# Patient Record
Sex: Female | Born: 1985 | Race: Black or African American | Hispanic: No | Marital: Married | Smoking: Never smoker
Health system: Southern US, Community
[De-identification: ages and names within clinical notes are randomized; demographics above are authoritative.]

## PROBLEM LIST (undated history)

## (undated) ENCOUNTER — Inpatient Hospital Stay (HOSPITAL_COMMUNITY): Payer: Self-pay

## (undated) DIAGNOSIS — B009 Herpesviral infection, unspecified: Secondary | ICD-10-CM

## (undated) DIAGNOSIS — N390 Urinary tract infection, site not specified: Secondary | ICD-10-CM

## (undated) DIAGNOSIS — I10 Essential (primary) hypertension: Secondary | ICD-10-CM

## (undated) DIAGNOSIS — G43909 Migraine, unspecified, not intractable, without status migrainosus: Secondary | ICD-10-CM

## (undated) DIAGNOSIS — J189 Pneumonia, unspecified organism: Secondary | ICD-10-CM

## (undated) DIAGNOSIS — E039 Hypothyroidism, unspecified: Secondary | ICD-10-CM

## (undated) DIAGNOSIS — B977 Papillomavirus as the cause of diseases classified elsewhere: Secondary | ICD-10-CM

## (undated) DIAGNOSIS — R87629 Unspecified abnormal cytological findings in specimens from vagina: Secondary | ICD-10-CM

## (undated) DIAGNOSIS — N870 Mild cervical dysplasia: Secondary | ICD-10-CM

## (undated) DIAGNOSIS — O139 Gestational [pregnancy-induced] hypertension without significant proteinuria, unspecified trimester: Secondary | ICD-10-CM

## (undated) HISTORY — DX: Migraine, unspecified, not intractable, without status migrainosus: G43.909

## (undated) HISTORY — DX: Mild cervical dysplasia: N87.0

## (undated) HISTORY — DX: Urinary tract infection, site not specified: N39.0

## (undated) HISTORY — DX: Papillomavirus as the cause of diseases classified elsewhere: B97.7

## (undated) HISTORY — PX: TUBAL LIGATION: SHX77

## (undated) HISTORY — PX: BREAST BIOPSY: SHX20

---

## 2003-10-05 HISTORY — PX: COLPOSCOPY: SHX161

## 2004-01-24 ENCOUNTER — Emergency Department (HOSPITAL_COMMUNITY): Admission: EM | Admit: 2004-01-24 | Discharge: 2004-01-24 | Payer: Self-pay | Admitting: Emergency Medicine

## 2005-04-10 ENCOUNTER — Emergency Department (HOSPITAL_COMMUNITY): Admission: EM | Admit: 2005-04-10 | Discharge: 2005-04-10 | Payer: Self-pay | Admitting: Emergency Medicine

## 2005-05-05 ENCOUNTER — Emergency Department: Payer: Self-pay | Admitting: Unknown Physician Specialty

## 2005-12-31 ENCOUNTER — Other Ambulatory Visit: Admission: RE | Admit: 2005-12-31 | Discharge: 2005-12-31 | Payer: Self-pay | Admitting: Obstetrics and Gynecology

## 2006-05-13 ENCOUNTER — Other Ambulatory Visit: Admission: RE | Admit: 2006-05-13 | Discharge: 2006-05-13 | Payer: Self-pay | Admitting: Obstetrics and Gynecology

## 2007-03-22 ENCOUNTER — Other Ambulatory Visit: Admission: RE | Admit: 2007-03-22 | Discharge: 2007-03-22 | Payer: Self-pay | Admitting: Obstetrics and Gynecology

## 2008-03-04 DIAGNOSIS — B977 Papillomavirus as the cause of diseases classified elsewhere: Secondary | ICD-10-CM

## 2008-03-04 HISTORY — DX: Papillomavirus as the cause of diseases classified elsewhere: B97.7

## 2008-03-22 ENCOUNTER — Other Ambulatory Visit: Admission: RE | Admit: 2008-03-22 | Discharge: 2008-03-22 | Payer: Self-pay | Admitting: Obstetrics and Gynecology

## 2009-05-15 ENCOUNTER — Encounter: Payer: Self-pay | Admitting: Women's Health

## 2009-05-15 ENCOUNTER — Other Ambulatory Visit: Admission: RE | Admit: 2009-05-15 | Discharge: 2009-05-15 | Payer: Self-pay | Admitting: Obstetrics and Gynecology

## 2009-05-15 ENCOUNTER — Ambulatory Visit: Payer: Self-pay | Admitting: Women's Health

## 2009-08-01 ENCOUNTER — Ambulatory Visit: Payer: Self-pay | Admitting: Obstetrics and Gynecology

## 2009-11-26 ENCOUNTER — Ambulatory Visit: Payer: Self-pay | Admitting: Women's Health

## 2009-11-26 ENCOUNTER — Other Ambulatory Visit: Admission: RE | Admit: 2009-11-26 | Discharge: 2009-11-26 | Payer: Self-pay | Admitting: Obstetrics and Gynecology

## 2010-07-29 ENCOUNTER — Ambulatory Visit: Payer: Self-pay | Admitting: Women's Health

## 2010-07-29 ENCOUNTER — Other Ambulatory Visit: Admission: RE | Admit: 2010-07-29 | Discharge: 2010-07-29 | Payer: Self-pay | Admitting: Obstetrics and Gynecology

## 2011-07-26 DIAGNOSIS — N870 Mild cervical dysplasia: Secondary | ICD-10-CM | POA: Insufficient documentation

## 2011-07-26 DIAGNOSIS — B977 Papillomavirus as the cause of diseases classified elsewhere: Secondary | ICD-10-CM | POA: Insufficient documentation

## 2011-08-02 ENCOUNTER — Ambulatory Visit (INDEPENDENT_AMBULATORY_CARE_PROVIDER_SITE_OTHER): Payer: BC Managed Care – PPO | Admitting: Women's Health

## 2011-08-02 ENCOUNTER — Encounter: Payer: Self-pay | Admitting: Women's Health

## 2011-08-02 ENCOUNTER — Other Ambulatory Visit (HOSPITAL_COMMUNITY)
Admission: RE | Admit: 2011-08-02 | Discharge: 2011-08-02 | Disposition: A | Discharge status: Home / Self Care | Payer: BC Managed Care – PPO | Source: Ambulatory Visit | Attending: Obstetrics and Gynecology | Admitting: Obstetrics and Gynecology

## 2011-08-02 VITALS — BP 120/70 | Ht 61.0 in | Wt 153.0 lb

## 2011-08-02 DIAGNOSIS — IMO0001 Reserved for inherently not codable concepts without codable children: Secondary | ICD-10-CM

## 2011-08-02 DIAGNOSIS — Z01419 Encounter for gynecological examination (general) (routine) without abnormal findings: Secondary | ICD-10-CM | POA: Insufficient documentation

## 2011-08-02 DIAGNOSIS — N898 Other specified noninflammatory disorders of vagina: Secondary | ICD-10-CM

## 2011-08-02 DIAGNOSIS — B373 Candidiasis of vulva and vagina: Secondary | ICD-10-CM

## 2011-08-02 DIAGNOSIS — R82998 Other abnormal findings in urine: Secondary | ICD-10-CM

## 2011-08-02 DIAGNOSIS — Z309 Encounter for contraceptive management, unspecified: Secondary | ICD-10-CM

## 2011-08-02 MED ORDER — NORETHIN ACE-ETH ESTRAD-FE 1-20 MG-MCG PO TABS: 1.0000 | ORAL_TABLET | Freq: Every day | ORAL | Status: DC

## 2011-08-02 MED ORDER — TERCONAZOLE 0.8 % VA CREA: 1.0000 | TOPICAL_CREAM | Freq: Every day | VAGINAL | Status: AC

## 2011-08-02 NOTE — Progress Notes (Signed)
Cassidy [name] 1986-05-10 [id number]    History:    The patient presents for annual exam.  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   Past medical history, past surgical history, family history and social history were all reviewed and documented in the EPIC chart.   ROS:  A  ROS was performed and pertinent positives and negatives are included in the history.  Exam:  Filed Vitals:   08/02/11 1543  BP: 120/70    General appearance:  Normal Head/Neck:  Normal, without cervical or supraclavicular adenopathy. Thyroid:  Symmetrical, normal in size, without palpable masses or nodularity. Respiratory  Effort:  Normal  Auscultation:  Clear without wheezing or rhonchi Cardiovascular  Auscultation:  Regular rate, without rubs, murmurs or gallops  Edema/varicosities:  Not grossly evident Abdominal  Soft,nontender, without masses, guarding or rebound.  Liver/spleen:  No organomegaly noted  Hernia:  None appreciated  Skin  Inspection:  Grossly normal  Palpation:  Grossly normal Neurologic/psychiatric  Orientation:  Normal with appropriate conversation.  Mood/affect:  Normal  Genitourinary    Breasts: Examined lying and sitting.     Right: Without masses, retractions, discharge or axillary adenopathy.     Left: Without masses, retractions, discharge or axillary adenopathy.   Inguinal/mons:  Normal without inguinal adenopathy  External genitalia:  Normal  BUS/Urethra/Skene's glands:  Normal  Bladder:  Normal  Vagina:  Normal  Cervix:  Normal  Uterus:   normal in size, shape and contour.  Midline and mobile  Adnexa/parametria:     Rt: Without masses or tenderness.   Lt: Without masses or tenderness.  Anus and perineum: Normal  Digital rectal exam: Normal sphincter tone without palpated masses or tenderness  Assessment/Plan:  24 y.o.[name] G0  for annual exam with a complaint of vaginal discharge and occasional headaches. 6 day monthly  cycle on YAZ, has not been sexually active in greater than a year. Gardasil series completed in 08. History of CIN-1 in 2009, normal Paps after.  Yeast  Plan: Reviewed slight increased risk with Yaz's progestin will change to Loestrin 1/20, prescription, proper use, slight risk for blood clots and strokes reviewed. Condoms encouraged to become sexually active. SBEs, exercise, calcium rich diet, MVI daily encouraged. Yeast - Terazol 3 one applicator at bedtime x3 prescription, proper use, was reviewed. CBC, UA and Pap.   Harrington Challenger [facility], 4:22 PM 08/02/2011 2

## 2011-08-19 ENCOUNTER — Emergency Department (HOSPITAL_COMMUNITY)
Admission: EM | Admit: 2011-08-19 | Discharge: 2011-08-19 | Disposition: A | Discharge status: Home / Self Care | Payer: BC Managed Care – PPO | Attending: Emergency Medicine | Admitting: Emergency Medicine

## 2011-08-19 ENCOUNTER — Encounter (HOSPITAL_COMMUNITY): Payer: Self-pay | Admitting: *Deleted

## 2011-08-19 ENCOUNTER — Emergency Department (HOSPITAL_COMMUNITY): Payer: BC Managed Care – PPO

## 2011-08-19 DIAGNOSIS — R079 Chest pain, unspecified: Secondary | ICD-10-CM | POA: Insufficient documentation

## 2011-08-19 DIAGNOSIS — J189 Pneumonia, unspecified organism: Secondary | ICD-10-CM

## 2011-08-19 DIAGNOSIS — R5381 Other malaise: Secondary | ICD-10-CM | POA: Insufficient documentation

## 2011-08-19 DIAGNOSIS — R0602 Shortness of breath: Secondary | ICD-10-CM | POA: Insufficient documentation

## 2011-08-19 DIAGNOSIS — R5383 Other fatigue: Secondary | ICD-10-CM | POA: Insufficient documentation

## 2011-08-19 DIAGNOSIS — R112 Nausea with vomiting, unspecified: Secondary | ICD-10-CM | POA: Insufficient documentation

## 2011-08-19 LAB — URINALYSIS, ROUTINE W REFLEX MICROSCOPIC
Glucose, UA: NEGATIVE mg/dL
Leukocytes, UA: NEGATIVE
Protein, ur: NEGATIVE mg/dL
pH: 5.5 (ref 5.0–8.0)

## 2011-08-19 LAB — COMPREHENSIVE METABOLIC PANEL
ALT: 15 U/L (ref 0–35)
AST: 22 U/L (ref 0–37)
Albumin: 3.6 g/dL (ref 3.5–5.2)
CO2: 22 mEq/L (ref 19–32)
Calcium: 8.4 mg/dL (ref 8.4–10.5)
Chloride: 101 mEq/L (ref 96–112)
GFR calc non Af Amer: >90 mL/min (ref >90)
Sodium: 134 mEq/L — ABNORMAL LOW (ref 135–145)
Total Bilirubin: 0.2 mg/dL — ABNORMAL LOW (ref 0.3–1.2)

## 2011-08-19 LAB — URINE MICROSCOPIC-ADD ON

## 2011-08-19 LAB — CBC
Platelets: 242 10*3/uL (ref 150–400)
RBC: 3.96 MIL/uL (ref 3.87–5.11)
RDW: 13.1 % (ref 11.5–15.5)
WBC: 6.9 10*3/uL (ref 4.0–10.5)

## 2011-08-19 MED ORDER — IBUPROFEN 800 MG PO TABS
800.0000 mg | ORAL_TABLET | Freq: Once | ORAL | Status: AC
  Administered 2011-08-19: 800 mg via ORAL

## 2011-08-19 MED ORDER — SODIUM CHLORIDE 0.9 % IV BOLUS (SEPSIS)
1000.0000 mL | Freq: Once | INTRAVENOUS | Status: AC
  Administered 2011-08-19: 1000 mL via INTRAVENOUS

## 2011-08-19 MED ORDER — AZITHROMYCIN 250 MG PO TABS: 500.0000 mg | ORAL_TABLET | Freq: Every day | ORAL | Status: DC

## 2011-08-19 MED ORDER — AZITHROMYCIN 250 MG PO TABS: ORAL_TABLET | ORAL | Status: DC

## 2011-08-19 MED ORDER — DEXTROSE 5 % IV SOLN
500.0000 mg | Freq: Once | INTRAVENOUS | Status: AC
  Administered 2011-08-19: 500 mg via INTRAVENOUS
  Filled 2011-08-19: qty 500

## 2011-08-19 MED ORDER — SODIUM CHLORIDE 0.9 % IV SOLN
Freq: Once | INTRAVENOUS | Status: AC
  Administered 2011-08-19: 16:00:00 via INTRAVENOUS

## 2011-08-19 MED ORDER — ONDANSETRON 4 MG PO TBDP
4.0000 mg | ORAL_TABLET | Freq: Once | ORAL | Status: AC
Start: 2011-08-19 — End: 2011-08-19
  Administered 2011-08-19: 4 mg via ORAL
  Filled 2011-08-19: qty 1

## 2011-08-19 MED ORDER — IBUPROFEN 800 MG PO TABS
ORAL_TABLET | ORAL | Status: AC
  Administered 2011-08-19: 800 mg via ORAL
  Filled 2011-08-19: qty 1

## 2011-08-19 MED ORDER — ACETAMINOPHEN 325 MG PO TABS
650.0000 mg | ORAL_TABLET | Freq: Once | ORAL | Status: AC
  Administered 2011-08-19: 650 mg via ORAL
  Filled 2011-08-19: qty 2

## 2011-08-19 NOTE — ED Notes (Signed)
Dr Fredricka Bonine notified of patient pulse rate at 1415.

## 2011-08-19 NOTE — ED Provider Notes (Signed)
History     CSN: [id number] Arrival date & time: 08/19/2011  1:43 PM   First MD Initiated Contact with Patient 08/19/11 1423      No chief complaint on file.   (Consider location/radiation/quality/duration/timing/severity/associated sxs/prior treatment) The history is provided by the patient.  Pt is a 25 YO female who comes in today with nausea and vomiting and cough. She states that she was coughing and having a sore throat which is now resolved. She is also having fevers at home. She is having some muscle soreness in her upper legs but no calf tenderness or soreness. 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 She has not had recent travel anywhere. She has no other medical problems and takes only birth control pills. She has taken some nyquil for her symptoms recently. No flu vaccine. Has had TB test in the past that was negative. She states that occasionally her sputum is productive and was slightly brown once. No weight loss or night sweats.   Past Medical History  Diagnosis Date  . 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  Family History  Problem Relation Age of Onset  . Hypertension Father   . Hypertension Mother     History  Substance Use Topics  . Smoking status: Never Smoker   . Smokeless tobacco: Never Used  . Alcohol Use: No    OB History    Grav Para Term Preterm Abortions TAB SAB Ect Mult Living   0               Review of Systems  Constitutional: Positive for fever, chills and fatigue. Negative for diaphoresis, activity change, appetite change and unexpected weight change.  HENT: Positive for congestion. Negative for sore throat and sinus pressure.   Respiratory: Positive for cough. Negative for choking, chest tightness, shortness of breath and stridor.   Cardiovascular: Negative for chest pain, palpitations and leg swelling.    Gastrointestinal: Positive for nausea and vomiting. Negative for abdominal pain, diarrhea, constipation, blood in stool and abdominal distention.  Genitourinary: Negative for dysuria, urgency, frequency, flank pain, decreased urine volume, enuresis and difficulty urinating.  Musculoskeletal: Negative.   Skin: Negative.   Neurological: Negative for dizziness, tremors, seizures, syncope, facial asymmetry, speech difficulty, weakness, light-headedness, numbness and headaches.  Hematological: Negative.   Psychiatric/Behavioral: Negative.     Allergies  Review of patient's allergies indicates no known allergies.  Home Medications   Current Outpatient Rx  Name Route Sig Dispense Refill  . ADAPALENE 0.1 % EX CREA Topical Apply 1 application topically at bedtime.      Azzie Roup ACE-ETH ESTRAD-FE 1-20 MG-MCG PO TABS Oral Take 1 tablet by mouth daily. 1 Package 11  . NYQUIL PO Oral Take 30 mLs by mouth 2 (two) times daily as needed. Cough and congestion       BP 124/59  Pulse 138  Temp(Src) 102.6 F (39.2 C) (Oral)  Resp 16  SpO2 99%  LMP 07/21/2011  Physical Exam  Constitutional: She is oriented to person, place, and time. She appears well-developed and well-nourished. Distressed: In mild distress.  HENT:  Head: Normocephalic and atraumatic.  Eyes: EOM are normal. Pupils are equal, round, and reactive to light.  Neck: Normal range of motion. Neck supple.  Cardiovascular: Regular rhythm.        Tachycardic.  Pulmonary/Chest:  Effort normal. No respiratory distress. She has no wheezes.       Good air flow bilaterally.  Abdominal: Soft. Bowel sounds are normal. She exhibits no distension. There is no tenderness. There is no rebound.  Musculoskeletal: Normal range of motion. She exhibits tenderness. She exhibits no edema.       Tender to palpation in the medial thighs.  Neurological: She is alert and oriented to person, place, and time. No cranial nerve deficit.  Skin: Skin is warm  and dry.    ED Course  Procedures (including critical care time)   Labs Reviewed  CBC  COMPREHENSIVE METABOLIC PANEL  LIPASE, BLOOD  URINALYSIS, ROUTINE W REFLEX MICROSCOPIC   Dg Chest Portable 1 View  08/19/2011  *RADIOLOGY REPORT*  Clinical Data: Cough and shortness of breath.  PORTABLE CHEST - 1 VIEW  Comparison: None.  Findings: Low lung volumes. Slight increased markings in the retrocardiac region and left lung base suggest early infiltrate. Clear right lung.  Normal heart size.  No bony abnormality.  IMPRESSION: Cannot exclude early left lower lobe infiltrate.  Original Report Authenticated By: Elsie Stain, M.D.     1. CAP (community acquired pneumonia)       MDM  Pt appears to be febrile. Will give tylenol for fever and zofran for nausea. Will also give some fluids to rehydrate her. Possible etiologies could include flu, viral GI infection. Pt does not have any focal respiratory sounds concerning for pneumonia. Will check basic lab work and CXR to rule out pneumonia. Will check CMP to rule out any gallbladder etiology.    3:45 PM  Pt's CXR is not excluding LLL infiltrate so will treat as CAP. Pending the rest of the laboratory results will pass off care to the ED doctor coming onto shift.      Genella Mech, MD 08/19/11 [phone]

## 2011-08-19 NOTE — ED Notes (Signed)
Awaiting completion of abx prior to releasing pt

## 2011-08-19 NOTE — ED Provider Notes (Signed)
  I performed a history and physical examination of Cassidy [name] and discussed her management with Dr. Dorise Hiss.  I agree with the history, physical, assessment, and plan of care, with the following exceptions: None The patient is awake, alert, and oriented appropriately, nontoxic in appearance, in no apparent distress, but with a fever and mild sinus tachycardia. Her HEENT exam is normal, with moist mucous membranes, no significant pharyngeal erythema, and no apparent otitis media. Her lungs are clear to auscultation bilaterally, but she has a rhonchorous cough. I was present for the following procedures: None Time Spent in Critical Care of the patient: None Time spent in discussions with the patient and family: 5 minutes. The patient appears to have an early left lower lobe pneumonia that would be a community-acquired pneumonia. Manus Rudd, MD 08/19/11 [phone]

## 2011-08-19 NOTE — ED Provider Notes (Signed)
Care of the patient was assumed from Dr. Doylene Canard. The patient's x-ray suggests left lower lobe pneumonia. The patient and her family were made aware of this finding. She notes that she is feeling minimally better here in the ED. Her vitals improved, though she remains tachycardic. She was afebrile following Tylenol provision. She will receive a first dose of antibiotics twice a day, and be discharged to continue her azithromycin at home.  Gerhard Munch, MD 08/19/11 [phone]

## 2011-08-20 ENCOUNTER — Emergency Department (HOSPITAL_COMMUNITY)
Admission: EM | Admit: 2011-08-20 | Discharge: 2011-08-20 | Disposition: A | Discharge status: Home / Self Care | Payer: BC Managed Care – PPO | Attending: Emergency Medicine | Admitting: Emergency Medicine

## 2011-08-20 ENCOUNTER — Encounter (HOSPITAL_COMMUNITY): Payer: Self-pay | Admitting: Emergency Medicine

## 2011-08-20 DIAGNOSIS — R5381 Other malaise: Secondary | ICD-10-CM | POA: Insufficient documentation

## 2011-08-20 DIAGNOSIS — R05 Cough: Secondary | ICD-10-CM | POA: Insufficient documentation

## 2011-08-20 DIAGNOSIS — R509 Fever, unspecified: Secondary | ICD-10-CM | POA: Insufficient documentation

## 2011-08-20 DIAGNOSIS — IMO0001 Reserved for inherently not codable concepts without codable children: Secondary | ICD-10-CM | POA: Insufficient documentation

## 2011-08-20 DIAGNOSIS — R059 Cough, unspecified: Secondary | ICD-10-CM | POA: Insufficient documentation

## 2011-08-20 DIAGNOSIS — R0602 Shortness of breath: Secondary | ICD-10-CM

## 2011-08-20 DIAGNOSIS — J189 Pneumonia, unspecified organism: Secondary | ICD-10-CM

## 2011-08-20 HISTORY — DX: Pneumonia, unspecified organism: J18.9

## 2011-08-20 MED ORDER — ALBUTEROL SULFATE (5 MG/ML) 0.5% IN NEBU
5.0000 mg | INHALATION_SOLUTION | Freq: Once | RESPIRATORY_TRACT | Status: AC
  Administered 2011-08-20: 5 mg via RESPIRATORY_TRACT
  Filled 2011-08-20: qty 1

## 2011-08-20 MED ORDER — IBUPROFEN 800 MG PO TABS
800.0000 mg | ORAL_TABLET | Freq: Once | ORAL | Status: AC
  Administered 2011-08-20: 800 mg via ORAL
  Filled 2011-08-20: qty 1

## 2011-08-20 MED ORDER — SODIUM CHLORIDE 0.9 % IV BOLUS (SEPSIS)
1000.0000 mL | Freq: Once | INTRAVENOUS | Status: AC
  Administered 2011-08-20: 1000 mL via INTRAVENOUS

## 2011-08-20 MED ORDER — SODIUM CHLORIDE 0.9 % IV BOLUS (SEPSIS)
500.0000 mL | Freq: Once | INTRAVENOUS | Status: AC
  Administered 2011-08-20: 500 mL via INTRAVENOUS

## 2011-08-20 MED ORDER — ALBUTEROL SULFATE HFA 108 (90 BASE) MCG/ACT IN AERS: 1.0000 | INHALATION_SPRAY | Freq: Four times a day (QID) | RESPIRATORY_TRACT | Status: DC | PRN

## 2011-08-20 NOTE — ED Provider Notes (Signed)
History     CSN: [id number] Arrival date & time: 08/20/2011 11:18 AM   First MD Initiated Contact with Patient 08/20/11 1132      Chief Complaint  Patient presents with  . Shortness of Breath  (Consider location/radiation/quality/duration/timing/severity/associated sxs/prior treatment) The history is provided by the patient.   patient is a 25 year old female who presents with a complaint of shortness of breath. She was seen and evaluated yesterday in the emergency department and was diagnosed with pneumonia. She was given a prescription for an antibiotic and has taken one day of the antibiotic. The patient reports she has had continued cough, fever, and persistent shortness of breath even at rest. There has not been a worsening of her symptoms. She denies headache, lightheadedness, chest pain, pleuritic pan. She has been taking NyQuil without relief of her symptoms. She has been taking Tylenol for her fever with partial relief.    Past Medical History  Diagnosis Date  . 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 Pneumonia     History reviewed. No pertinent past surgical history.  Family History  Problem Relation Age of Onset  . Hypertension Father   . Hypertension Mother     History  Substance Use Topics  . Smoking status: Never Smoker   . Smokeless tobacco: Never Used  . Alcohol Use: No     Review of Systems  Constitutional: Positive for fever, chills and appetite change.  HENT: Positive for congestion and rhinorrhea. Negative for ear pain, nosebleeds, sore throat, neck pain, neck stiffness and tinnitus.   Eyes: Negative for pain and visual disturbance.  Respiratory: Positive for cough and shortness of breath. Negative for chest tightness.   Cardiovascular: Negative for chest pain, palpitations and leg swelling.  Gastrointestinal: Positive for nausea. Negative for vomiting and abdominal pain.    Musculoskeletal: Positive for myalgias. Negative for back pain, joint swelling and gait problem.  Skin: Negative for rash and wound.  Neurological: Positive for weakness. Negative for dizziness, syncope and numbness.  Hematological: Does not bruise/bleed easily.  Psychiatric/Behavioral: Negative for behavioral problems and confusion.    Allergies  Review of patient's allergies indicates no known allergies.  Home Medications   Current Outpatient Rx  Name Route Sig Dispense Refill  . ACETAMINOPHEN 325 MG PO TABS Oral Take 325 mg by mouth every 6 (six) hours as needed. pain     . ADAPALENE 0.1 % EX CREA Topical Apply 1 application topically at bedtime.     . AZITHROMYCIN 250 MG PO TABS Oral Take 500 mg by mouth daily. Day 2 of therapy. For 5 day course     . NORETHIN ACE-ETH ESTRAD-FE 1-20 MG-MCG PO TABS Oral Take 1 tablet by mouth daily. 1 Package 11  . NYQUIL PO Oral Take 30 mLs by mouth 2 (two) times daily as needed. Cough and congestion      BP 123/67  Pulse 112  Temp(Src) 101.8 F (38.8 C) (Oral)  Resp 23  SpO2 99%  LMP 07/21/2011  Physical Exam  Constitutional: She is oriented to person, place, and time. She appears well-developed and well-nourished.       Uncomfortable appearing  HENT:  Head: Normocephalic.       Mucous membranes moist  Eyes: Pupils are equal, round, and reactive to light.  Neck: Normal range of motion. Neck supple.  Cardiovascular: Regular rhythm, normal heart sounds and intact distal  pulses.        Tachycardia  Pulmonary/Chest: Effort normal and breath sounds normal. No respiratory distress. She has no wheezes. She has no rales.       Good air movement bilaterally  Abdominal: Soft. Bowel sounds are normal. She exhibits no distension. There is no tenderness.  Musculoskeletal: Normal range of motion. She exhibits no edema and no tenderness.  Lymphadenopathy:    She has no cervical adenopathy.  Neurological: She is alert and oriented to person,  place, and time. No cranial nerve deficit. Coordination normal.  Skin: Skin is warm and dry. No rash noted.  Psychiatric: She has a normal mood and affect. Her behavior is normal.    ED Course  Procedures (including critical care time)  Labs Reviewed - No data to display Dg Chest Portable 1 View  08/19/2011  *RADIOLOGY REPORT*  Clinical Data: Cough and shortness of breath.  PORTABLE CHEST - 1 VIEW  Comparison: None.  Findings: Low lung volumes. Slight increased markings in the retrocardiac region and left lung base suggest early infiltrate. Clear right lung.  Normal heart size.  No bony abnormality.  IMPRESSION: Cannot exclude early left lower lobe infiltrate.  Original Report Authenticated By: Elsie Stain, M.D.     1. Pneumonia   2. Shortness of breath       MDM  An otherwise healthy young female with diagnosis of pneumonia as seen on yesterday's x-ray. Her shortness of breath has been relieved significantly with an albuterol treatment. However she is still tachycardic. I will give her some IV fluids with plan to discharge home afterward. 2:58pm        Elwyn Reach [place], [place] 08/21/11 [phone]

## 2011-08-20 NOTE — ED Notes (Signed)
Pt presenting to ed with c/o diagnosed yesterday here with pneumonia and she is not feeling any better

## 2011-08-21 NOTE — ED Provider Notes (Signed)
  I performed a history and physical examination of Langley Gauss and discussed her management with Dr. Dorise Hiss.  I agree with the history, physical, assessment, and plan of care, with the following exceptions: None  I was present for the following procedures: None Time Spent in Critical Care of the patient: None Time spent in discussions with the patient and family: 5 mins  Keandria Berrocal D    Felisa Bonier, MD 08/21/11 1432

## 2011-08-23 NOTE — ED Provider Notes (Signed)
Evaluation and management procedures were performed by the PA/NP under my supervision/collaboration.   Dione Booze, MD 08/23/11 1256

## 2011-09-09 ENCOUNTER — Ambulatory Visit (INDEPENDENT_AMBULATORY_CARE_PROVIDER_SITE_OTHER): Payer: BC Managed Care – PPO

## 2011-09-09 DIAGNOSIS — D649 Anemia, unspecified: Secondary | ICD-10-CM

## 2011-09-09 DIAGNOSIS — L241 Irritant contact dermatitis due to oils and greases: Secondary | ICD-10-CM

## 2011-09-09 DIAGNOSIS — J309 Allergic rhinitis, unspecified: Secondary | ICD-10-CM

## 2011-09-20 ENCOUNTER — Ambulatory Visit (INDEPENDENT_AMBULATORY_CARE_PROVIDER_SITE_OTHER): Payer: BC Managed Care – PPO

## 2011-09-20 DIAGNOSIS — E0590 Thyrotoxicosis, unspecified without thyrotoxic crisis or storm: Secondary | ICD-10-CM

## 2011-09-30 ENCOUNTER — Other Ambulatory Visit: Payer: Self-pay | Admitting: Family Medicine

## 2011-09-30 DIAGNOSIS — E0590 Thyrotoxicosis, unspecified without thyrotoxic crisis or storm: Secondary | ICD-10-CM

## 2011-10-01 ENCOUNTER — Institutional Professional Consult (permissible substitution): Payer: BC Managed Care – PPO | Admitting: Internal Medicine

## 2011-10-05 DIAGNOSIS — J189 Pneumonia, unspecified organism: Secondary | ICD-10-CM

## 2011-10-05 HISTORY — DX: Pneumonia, unspecified organism: J18.9

## 2011-10-09 ENCOUNTER — Ambulatory Visit (INDEPENDENT_AMBULATORY_CARE_PROVIDER_SITE_OTHER): Payer: BC Managed Care – PPO

## 2011-10-09 DIAGNOSIS — R000 Tachycardia, unspecified: Secondary | ICD-10-CM

## 2011-10-09 DIAGNOSIS — N898 Other specified noninflammatory disorders of vagina: Secondary | ICD-10-CM

## 2011-10-09 DIAGNOSIS — I10 Essential (primary) hypertension: Secondary | ICD-10-CM

## 2011-10-12 ENCOUNTER — Encounter (HOSPITAL_COMMUNITY)
Admission: RE | Admit: 2011-10-12 | Discharge: 2011-10-12 | Disposition: A | Discharge status: Home / Self Care | Payer: BC Managed Care – PPO | Source: Ambulatory Visit | Attending: Family Medicine | Admitting: Family Medicine

## 2011-10-12 DIAGNOSIS — E0590 Thyrotoxicosis, unspecified without thyrotoxic crisis or storm: Secondary | ICD-10-CM | POA: Insufficient documentation

## 2011-10-13 ENCOUNTER — Encounter (HOSPITAL_COMMUNITY)
Admission: RE | Admit: 2011-10-13 | Discharge: 2011-10-13 | Disposition: A | Discharge status: Home / Self Care | Payer: BC Managed Care – PPO | Source: Ambulatory Visit | Attending: Family Medicine | Admitting: Family Medicine

## 2011-10-13 DIAGNOSIS — E0590 Thyrotoxicosis, unspecified without thyrotoxic crisis or storm: Secondary | ICD-10-CM | POA: Insufficient documentation

## 2011-10-13 DIAGNOSIS — G478 Other sleep disorders: Secondary | ICD-10-CM | POA: Insufficient documentation

## 2011-10-13 MED ORDER — SODIUM IODIDE I 131 CAPSULE: 11.7000 | Freq: Once | INTRAVENOUS | Status: AC | PRN

## 2011-10-13 MED ORDER — SODIUM PERTECHNETATE TC 99M INJECTION
10.0000 | Freq: Once | INTRAVENOUS | Status: AC | PRN
  Administered 2011-10-13: 10 via INTRAVENOUS

## 2011-10-20 ENCOUNTER — Telehealth: Payer: Self-pay | Admitting: *Deleted

## 2011-10-20 NOTE — Telephone Encounter (Signed)
Pt is taking loestrin 1/20 since 08/02/11 pt wants to switch to another birth contol. Pt said that her skin is breaking out to much. She has not taking pill for about two weeks now and has noticed her skin clearing up. Please advise

## 2011-10-23 ENCOUNTER — Ambulatory Visit (INDEPENDENT_AMBULATORY_CARE_PROVIDER_SITE_OTHER): Payer: BC Managed Care – PPO

## 2011-10-23 DIAGNOSIS — E069 Thyroiditis, unspecified: Secondary | ICD-10-CM

## 2011-10-25 MED ORDER — DESOGESTREL-ETHINYL ESTRADIOL 0.15-0.02/0.01 MG (21/5) PO TABS: 1.0000 | ORAL_TABLET | Freq: Every day | ORAL | Status: DC

## 2011-10-25 NOTE — Telephone Encounter (Signed)
Please call patient,  try Mircette. Explained progestin is different may help with skin. Start first day of her next cycle, condoms until and condoms first month. Instructed to call if no relief of problem. Please call in Mircette 1 by mouth daily number one pack with refills until her next annual exam

## 2011-10-25 NOTE — Telephone Encounter (Signed)
Pt informed with the below note, rx sent to pharmacy. 

## 2011-11-02 ENCOUNTER — Telehealth: Payer: Self-pay

## 2011-11-02 NOTE — Telephone Encounter (Signed)
.  [facility] Pt states she has called several times recently to receive her thyroid lab results. Has not heard back. Aware she has hyperthyroidism and is supposed to have rx based on results. States feels sluggish. Please call. Best: [phone]  bf

## 2011-11-03 NOTE — Telephone Encounter (Signed)
Dr Hal Hope will call patient with results of lab

## 2011-11-04 ENCOUNTER — Telehealth: Payer: Self-pay | Admitting: Family Medicine

## 2011-11-05 NOTE — Telephone Encounter (Signed)
LMOM to CB on both numbers 

## 2011-11-05 NOTE — Telephone Encounter (Signed)
.  [facility] WE HAD CALLED PT THE OTHER DAY FOR RESULTS AND SHE IS CALLING AGAIN. PLEASE CALL [phone]

## 2011-11-05 NOTE — Telephone Encounter (Signed)
Pt called back and gave her message from Dr Hal Hope that she will consult w/ endocrinologist and get back in touch with pt and that her labs are looking better. Pt in agreement.

## 2011-11-10 ENCOUNTER — Telehealth: Payer: Self-pay | Admitting: Family Medicine

## 2011-11-10 NOTE — Telephone Encounter (Signed)
Called patient to review recommendations from Dr. Jenelle Mages, [facility].  Patient to continue B-Blocker and check T3/Free T4 monthly. Patient asked to return call.

## 2011-11-12 ENCOUNTER — Telehealth: Payer: Self-pay | Admitting: Family Medicine

## 2011-11-12 NOTE — Telephone Encounter (Signed)
Unable to reach patient by phone to discuss thyroid testing. Letter typed and to be sent by certified mail with prescription for metoprolol 50mg  daily.

## 2011-11-12 NOTE — Telephone Encounter (Signed)
Certified letter done. Will go out with mondays mail.

## 2011-11-13 ENCOUNTER — Telehealth: Payer: Self-pay

## 2011-11-13 NOTE — Telephone Encounter (Signed)
Patient returning call to [place] and dr Hal Hope. Was seen last week.  Best phone number for patient is [phone]

## 2011-11-14 NOTE — Telephone Encounter (Signed)
ADVISED PT WHAT WAS STATED IN LETTER.

## 2012-01-06 NOTE — Telephone Encounter (Signed)
See note under contact information 11/12/11

## 2012-10-04 HISTORY — PX: BREAST BIOPSY: SHX20

## 2014-04-30 DIAGNOSIS — O1490 Unspecified pre-eclampsia, unspecified trimester: Secondary | ICD-10-CM

## 2015-10-05 HISTORY — PX: OTHER SURGICAL HISTORY: SHX169

## 2016-09-05 ENCOUNTER — Encounter (HOSPITAL_COMMUNITY): Payer: Self-pay | Admitting: *Deleted

## 2016-09-05 ENCOUNTER — Inpatient Hospital Stay (HOSPITAL_COMMUNITY)
Admission: AD | Admit: 2016-09-05 | Discharge: 2016-09-05 | Disposition: A | Discharge status: Home / Self Care | Payer: BC Managed Care – PPO | Source: Ambulatory Visit | Attending: Obstetrics & Gynecology | Admitting: Obstetrics & Gynecology

## 2016-09-05 ENCOUNTER — Inpatient Hospital Stay (HOSPITAL_COMMUNITY): Payer: BC Managed Care – PPO

## 2016-09-05 DIAGNOSIS — Z3A10 10 weeks gestation of pregnancy: Secondary | ICD-10-CM | POA: Diagnosis not present

## 2016-09-05 DIAGNOSIS — O4690 Antepartum hemorrhage, unspecified, unspecified trimester: Secondary | ICD-10-CM | POA: Diagnosis present

## 2016-09-05 DIAGNOSIS — O034 Incomplete spontaneous abortion without complication: Secondary | ICD-10-CM | POA: Diagnosis not present

## 2016-09-05 DIAGNOSIS — O3680X0 Pregnancy with inconclusive fetal viability, not applicable or unspecified: Secondary | ICD-10-CM | POA: Insufficient documentation

## 2016-09-05 DIAGNOSIS — R109 Unspecified abdominal pain: Secondary | ICD-10-CM | POA: Insufficient documentation

## 2016-09-05 DIAGNOSIS — O209 Hemorrhage in early pregnancy, unspecified: Secondary | ICD-10-CM | POA: Diagnosis present

## 2016-09-05 DIAGNOSIS — O26891 Other specified pregnancy related conditions, first trimester: Secondary | ICD-10-CM | POA: Insufficient documentation

## 2016-09-05 LAB — CBC
HCT: 31.9 % — ABNORMAL LOW (ref 36.0–46.0)
HEMOGLOBIN: 10.5 g/dL — AB (ref 12.0–15.0)
MCH: 27.1 pg (ref 26.0–34.0)
MCHC: 32.9 g/dL (ref 30.0–36.0)
MCV: 82.2 fL (ref 78.0–100.0)
PLATELETS: 288 10*3/uL (ref 150–400)
RBC: 3.88 MIL/uL (ref 3.87–5.11)
RDW: 14.8 % (ref 11.5–15.5)
WBC: 12.4 10*3/uL — ABNORMAL HIGH (ref 4.0–10.5)

## 2016-09-05 MED ORDER — ONDANSETRON HCL 4 MG/2ML IJ SOLN
4.0000 mg | Freq: Once | INTRAMUSCULAR | Status: AC
Start: 2016-09-05 — End: 2016-09-05
  Administered 2016-09-05: 4 mg via INTRAVENOUS
  Filled 2016-09-05: qty 2

## 2016-09-05 MED ORDER — OXYCODONE HCL 5 MG PO TABS: 10.0000 mg | ORAL_TABLET | Freq: Once | ORAL | Status: DC

## 2016-09-05 MED ORDER — HYDROMORPHONE HCL 1 MG/ML IJ SOLN
1.0000 mg | Freq: Once | INTRAMUSCULAR | Status: AC
  Administered 2016-09-05: 1 mg via INTRAVENOUS
  Filled 2016-09-05: qty 1

## 2016-09-05 MED ORDER — SODIUM CHLORIDE 0.9 % IV BOLUS (SEPSIS)
1000.0000 mL | Freq: Once | INTRAVENOUS | Status: AC
  Administered 2016-09-05: 1000 mL via INTRAVENOUS

## 2016-09-05 MED ORDER — OXYCODONE-ACETAMINOPHEN 5-325 MG PO TABS
2.0000 | ORAL_TABLET | Freq: Once | ORAL | Status: DC
Start: 2016-09-05 — End: 2016-09-05

## 2016-09-05 MED ORDER — HYDROCODONE-ACETAMINOPHEN 5-325 MG PO TABS: 1.0000 | ORAL_TABLET | Freq: Four times a day (QID) | ORAL | 0 refills | Status: DC | PRN

## 2016-09-05 NOTE — MAU Provider Note (Signed)
Chief Complaint: Abdominal Pain and Vaginal Bleeding   First Provider Initiated Contact with Patient 09/05/16 1943        SUBJECTIVE HPI: Cassidy [name] is a 30 y.o. G2P0101 at [redacted]w[redacted]d by LMP who presents to maternity admissions reporting bleeding, cramping and nausea. States had a missed abortion diagnosed in [place], where she is from this week.  States cramping got more severe today.  .She denies vaginal itching/burning, urinary symptoms, h/a, dizziness, n/v, or fever/chills.     Abdominal Pain  This is a recurrent problem. The current episode started in the past 7 days. The onset quality is gradual. The problem occurs constantly. The problem has been unchanged. The pain is located in the suprapubic region, LLQ and RUQ. The quality of the pain is cramping and sharp. The abdominal pain does not radiate. Pertinent negatives include no constipation, diarrhea, fever, frequency, myalgias, nausea or vomiting. Nothing aggravates the pain. The pain is relieved by nothing.  Vaginal Bleeding  The patient's primary symptoms include pelvic pain and vaginal bleeding. The patient's pertinent negatives include no genital itching or genital lesions. This is a recurrent problem. The current episode started in the past 7 days. The problem occurs constantly. The problem has been unchanged. The pain is moderate. The problem affects both sides. She is pregnant. Associated symptoms include abdominal pain. Pertinent negatives include no chills, constipation, diarrhea, fever, frequency, nausea or vomiting. The vaginal discharge was bloody. The vaginal bleeding is heavier than menses. She has been passing clots. She has been passing tissue. Nothing aggravates the symptoms. She has tried nothing for the symptoms.   RN Note: Pt states she is having a miscarriage, is very nauseated, abd pain has become worse throughout the day.  Is bleeding, has changed 4-5 pads today.  Pt sees OB in [place], found out she was going to miscarry  on Tuesday. Pt has taken tylenol, is not helping.  Past Medical History:  Diagnosis Date  . 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 Pneumonia    Past Surgical History:  Procedure Laterality Date  . CESAREAN SECTION     Social History   Social History  . Marital status: Married    Spouse name: N/A  . Number of children: N/A  . Years of education: N/A   Occupational History  . Not on file.   Social History Main Topics  . Smoking status: Never Smoker  . Smokeless tobacco: Never Used  . Alcohol use No  . Drug use: No  . Sexual activity: Yes    Birth control/ protection: Condom   Other Topics Concern  . Not on file   Social History Narrative  . No narrative on file   No current facility-administered medications on file prior to encounter.    Current Outpatient Prescriptions on File Prior to Encounter  Medication Sig Dispense Refill  . acetaminophen (TYLENOL) 325 MG tablet Take 325 mg by mouth every 6 (six) hours as needed. pain     . adapalene (DIFFERIN) 0.1 % cream Apply 1 application topically at bedtime.     Marland Kitchen albuterol (PROVENTIL HFA;VENTOLIN HFA) 108 (90 BASE) MCG/ACT inhaler Inhale 1-2 puffs into the lungs every 6 (six) hours as needed for wheezing. 1 Inhaler 0  . desogestrel-ethinyl estradiol (KARIVA,AZURETTE,MIRCETTE) 0.15-0.02/0.01 MG (21/5) tablet Take 1 tablet by mouth daily. 1 Package 9  . norethindrone-ethinyl estradiol (JUNEL FE,GILDESS FE,LOESTRIN FE) 1-20 MG-MCG  tablet Take 1 tablet by mouth daily. 1 Package 11  . Pseudoeph-Doxylamine-DM-APAP (NYQUIL PO) Take 30 mLs by mouth 2 (two) times daily as needed. Cough and congestion     Allergies  Allergen Reactions  . Latex Rash    I have reviewed patient's Past Medical Hx, Surgical Hx, Family Hx, Social Hx, medications and allergies.   ROS:  Review of Systems  Constitutional: Negative for chills and fever.  Gastrointestinal:  Positive for abdominal pain. Negative for constipation, diarrhea, nausea and vomiting.  Genitourinary: Positive for pelvic pain and vaginal bleeding. Negative for frequency.  Musculoskeletal: Negative for myalgias.    Other systems negative   Physical Exam  Physical Exam Patient Vitals for the past 24 hrs:  BP Temp Temp src Pulse Resp  09/05/16 1849 130/78 98 F (36.7 C) Oral 101 18   Constitutional: Well-developed, well-nourished female in no acute distress.  Cardiovascular: normal rate Respiratory: normal effort GI: Abd soft, non-tender. Pos BS x 4 MS: Extremities nontender, no edema, normal ROM Neurologic: Alert and oriented x 4.  GU: Neg CVAT.  PELVIC EXAM: Cervix pink, without lesion, moderate bloody discharge, vaginal walls and external genitalia normal       One moderate clot removed.   LAB RESULTS Results for orders placed or performed during the hospital encounter of 09/05/16 (from the past 24 hour(s))  CBC     Status: Abnormal   Collection Time: 09/05/16  7:51 PM  Result Value Ref Range   WBC 12.4 (H) 4.0 - 10.5 K/uL   RBC 3.88 3.87 - 5.11 MIL/uL   Hemoglobin 10.5 (L) 12.0 - 15.0 g/dL   HCT 64.431.9 (L) 03.436.0 - 74.246.0 %   MCV 82.2 78.0 - 100.0 fL   MCH 27.1 26.0 - 34.0 pg   MCHC 32.9 30.0 - 36.0 g/dL   RDW 59.514.8 63.811.5 - 75.615.5 %   Platelets 288 150 - 400 K/uL  ABO/Rh     Status: None (Preliminary result)   Collection Time: 09/05/16  7:51 PM  Result Value Ref Range   ABO/RH(D) A POS     --/--/A POS (12/03 1951)  IMAGING [place] Ob Comp Less 14 Wks  Result Date: 09/05/2016 CLINICAL DATA:  Vaginal bleeding in first trimester pregnancy. Gestational age by LMP of 10 weeks 6 days. EXAM: OBSTETRIC <14 WK US AND TRANSVAGINAL OB US TECHNIQUE: Both transabdominal and transvaginal ultrasound examinations were performed for complete evaluation of the gestation as well as the maternal uterus, adnexal regions, and pelvic cul-de-sac. Transvaginal technique was performed to assess early  pregnancy. COMPARISON:  None. FINDINGS: Intrauterine gestational sac: Probable single intrauterine gestational sac with irregular contour in lower uterine segment Yolk sac:  Not Visualized. Embryo:  Not Visualized. MSD: 13  mm   6 w   1  d Subchorionic hemorrhage:  None visualized. Maternal uterus/adnexae: Normal appearance of both ovaries. No mass or free fluid identified. IMPRESSION: Findings are suspicious but not yet definitive for failed pregnancy. Recommend follow-up US in 10-14 days for definitive diagnosis. This recommendation follows SRU consensus guidelines: Diagnostic Criteria for Nonviable Pregnancy Early in the First Trimester. Malva Limes Engl J Med 2013; 433:2951-88; 369:1443-51. Electronically Signed   By: Myles RosenthalJohn  Stahl M.D.   On: 09/05/2016 21:05   [place] Ob Transvaginal  Result Date: 09/05/2016 CLINICAL DATA:  Vaginal bleeding in first trimester pregnancy. Gestational age by LMP of 10 weeks 6 days. EXAM: OBSTETRIC <14 WK US AND TRANSVAGINAL OB US TECHNIQUE: Both transabdominal and transvaginal ultrasound examinations were performed for complete  evaluation of the gestation as well as the maternal uterus, adnexal regions, and pelvic cul-de-sac. Transvaginal technique was performed to assess early pregnancy. COMPARISON:  None. FINDINGS: Intrauterine gestational sac: Probable single intrauterine gestational sac with irregular contour in lower uterine segment Yolk sac:  Not Visualized. Embryo:  Not Visualized. MSD: 13  mm   6 w   1  d Subchorionic hemorrhage:  None visualized. Maternal uterus/adnexae: Normal appearance of both ovaries. No mass or free fluid identified. IMPRESSION: Findings are suspicious but not yet definitive for failed pregnancy. Recommend follow-up US in 10-14 days for definitive diagnosis. This recommendation follows SRU consensus guidelines: Diagnostic Criteria for Nonviable Pregnancy Early in the First Trimester. Malva Limes Engl J Med 2013; [id number]:1191-47; 369:1443-51. Electronically Signed   By: Myles RosenthalJohn  Stahl M.D.   On: 09/05/2016  21:05    MAU Management/MDM: US showed gestational sac in lower uterine segment.  Yolk sac not seen. Yolk sac had been seen previously   Never had appearance of fetal pole. Discussed with patient and her husband. They decline cytotec and want to proceed expectantly until they return home Bleeding precautions reviewed Will give Rx for vicodin for pain.  May use ibuprofen as well.   ASSESSMENT 1. Vaginal bleeding in pregnancy   2. Vaginal bleeding in pregnancy     PLAN Discharge home Bleeding precautions Followup with OB doctor in [place].  Pt stable at time of discharge. Encouraged to return here or to other Urgent Care/ED if she develops worsening of symptoms, increase in pain, fever, or other concerning symptoms.    Wynelle BourgeoisMarie Karlton Maya CNM, MSN Certified Nurse-Midwife 09/05/2016  9:01 PM

## 2016-09-05 NOTE — MAU Note (Signed)
Pt states she is having a miscarriage, is very nauseated, abd pain has become worse throughout the day.  Is bleeding, has changed 4-5 pads today.  Pt sees OB in [place], found out she was going to miscarry on Tuesday. Pt has taken tylenol, is not helping.

## 2016-09-05 NOTE — Discharge Instructions (Signed)

## 2016-09-06 LAB — ABO/RH: ABO/RH(D): A POS

## 2017-03-23 ENCOUNTER — Encounter (HOSPITAL_COMMUNITY): Payer: Self-pay

## 2017-03-23 ENCOUNTER — Inpatient Hospital Stay (HOSPITAL_COMMUNITY)
Admission: AD | Admit: 2017-03-23 | Discharge: 2017-03-23 | Disposition: A | Discharge status: Home / Self Care | Payer: BC Managed Care – PPO | Source: Ambulatory Visit | Attending: Obstetrics and Gynecology | Admitting: Obstetrics and Gynecology

## 2017-03-23 DIAGNOSIS — Z3A12 12 weeks gestation of pregnancy: Secondary | ICD-10-CM

## 2017-03-23 DIAGNOSIS — Z888 Allergy status to other drugs, medicaments and biological substances status: Secondary | ICD-10-CM | POA: Insufficient documentation

## 2017-03-23 DIAGNOSIS — Z7982 Long term (current) use of aspirin: Secondary | ICD-10-CM | POA: Diagnosis not present

## 2017-03-23 DIAGNOSIS — Z8249 Family history of ischemic heart disease and other diseases of the circulatory system: Secondary | ICD-10-CM | POA: Diagnosis not present

## 2017-03-23 DIAGNOSIS — Z79899 Other long term (current) drug therapy: Secondary | ICD-10-CM | POA: Diagnosis not present

## 2017-03-23 DIAGNOSIS — S3992XA Unspecified injury of lower back, initial encounter: Secondary | ICD-10-CM

## 2017-03-23 DIAGNOSIS — O34219 Maternal care for unspecified type scar from previous cesarean delivery: Secondary | ICD-10-CM | POA: Diagnosis not present

## 2017-03-23 DIAGNOSIS — O26891 Other specified pregnancy related conditions, first trimester: Secondary | ICD-10-CM | POA: Diagnosis not present

## 2017-03-23 DIAGNOSIS — O9A211 Injury, poisoning and certain other consequences of external causes complicating pregnancy, first trimester: Secondary | ICD-10-CM

## 2017-03-23 LAB — URINALYSIS, ROUTINE W REFLEX MICROSCOPIC
Bilirubin Urine: NEGATIVE
Glucose, UA: NEGATIVE mg/dL
KETONES UR: NEGATIVE mg/dL
Leukocytes, UA: NEGATIVE
Nitrite: NEGATIVE
PROTEIN: NEGATIVE mg/dL
Specific Gravity, Urine: 1.025 (ref 1.005–1.030)
pH: 5 (ref 5.0–8.0)

## 2017-03-23 MED ORDER — CYCLOBENZAPRINE HCL 5 MG PO TABS: 5.0000 mg | ORAL_TABLET | Freq: Three times a day (TID) | ORAL | 0 refills | Status: DC | PRN
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
## 2017-05-06 ENCOUNTER — Other Ambulatory Visit: Payer: Self-pay

## 2017-05-11 ENCOUNTER — Encounter (HOSPITAL_COMMUNITY): Payer: Self-pay

## 2017-07-19 ENCOUNTER — Encounter (HOSPITAL_COMMUNITY): Payer: Self-pay | Admitting: *Deleted

## 2017-07-20 ENCOUNTER — Other Ambulatory Visit (HOSPITAL_COMMUNITY): Payer: Self-pay | Admitting: Obstetrics & Gynecology

## 2017-07-20 DIAGNOSIS — Z3689 Encounter for other specified antenatal screening: Secondary | ICD-10-CM

## 2017-07-20 DIAGNOSIS — Z8759 Personal history of other complications of pregnancy, childbirth and the puerperium: Secondary | ICD-10-CM

## 2017-07-20 DIAGNOSIS — O283 Abnormal ultrasonic finding on antenatal screening of mother: Secondary | ICD-10-CM

## 2017-07-20 DIAGNOSIS — Z3A17 17 weeks gestation of pregnancy: Secondary | ICD-10-CM

## 2017-07-21 ENCOUNTER — Other Ambulatory Visit (HOSPITAL_COMMUNITY): Payer: Self-pay | Admitting: Obstetrics & Gynecology

## 2017-07-21 ENCOUNTER — Ambulatory Visit (HOSPITAL_COMMUNITY)
Admission: RE | Admit: 2017-07-21 | Discharge: 2017-07-21 | Disposition: A | Discharge status: Home / Self Care | Payer: BC Managed Care – PPO | Source: Ambulatory Visit | Attending: Obstetrics and Gynecology | Admitting: Obstetrics and Gynecology

## 2017-07-21 ENCOUNTER — Other Ambulatory Visit (HOSPITAL_COMMUNITY): Payer: Self-pay | Admitting: *Deleted

## 2017-07-21 ENCOUNTER — Ambulatory Visit (HOSPITAL_COMMUNITY): Admission: RE | Admit: 2017-07-21 | Payer: BC Managed Care – PPO | Source: Ambulatory Visit

## 2017-07-21 ENCOUNTER — Encounter (HOSPITAL_COMMUNITY): Payer: Self-pay

## 2017-07-21 DIAGNOSIS — O289 Unspecified abnormal findings on antenatal screening of mother: Secondary | ICD-10-CM

## 2017-07-21 DIAGNOSIS — O09899 Supervision of other high risk pregnancies, unspecified trimester: Secondary | ICD-10-CM

## 2017-07-21 DIAGNOSIS — Z3689 Encounter for other specified antenatal screening: Secondary | ICD-10-CM

## 2017-07-21 DIAGNOSIS — O09219 Supervision of pregnancy with history of pre-term labor, unspecified trimester: Secondary | ICD-10-CM

## 2017-07-21 DIAGNOSIS — Z8759 Personal history of other complications of pregnancy, childbirth and the puerperium: Secondary | ICD-10-CM

## 2017-07-21 DIAGNOSIS — O09213 Supervision of pregnancy with history of pre-term labor, third trimester: Secondary | ICD-10-CM | POA: Insufficient documentation

## 2017-07-21 DIAGNOSIS — O34219 Maternal care for unspecified type scar from previous cesarean delivery: Secondary | ICD-10-CM

## 2017-07-21 DIAGNOSIS — O365930 Maternal care for other known or suspected poor fetal growth, third trimester, not applicable or unspecified: Secondary | ICD-10-CM

## 2017-07-21 DIAGNOSIS — Z3A29 29 weeks gestation of pregnancy: Secondary | ICD-10-CM

## 2017-07-21 DIAGNOSIS — Z3A17 17 weeks gestation of pregnancy: Secondary | ICD-10-CM

## 2017-07-21 DIAGNOSIS — O283 Abnormal ultrasonic finding on antenatal screening of mother: Secondary | ICD-10-CM

## 2017-07-21 HISTORY — DX: Herpesviral infection, unspecified: B00.9

## 2017-07-21 HISTORY — DX: Essential (primary) hypertension: I10

## 2017-07-21 HISTORY — DX: Unspecified abnormal cytological findings in specimens from vagina: R87.629

## 2017-07-21 HISTORY — DX: Hypothyroidism, unspecified: E03.9

## 2017-07-21 HISTORY — DX: Gestational (pregnancy-induced) hypertension without significant proteinuria, unspecified trimester: O13.9

## 2017-08-11 ENCOUNTER — Encounter (HOSPITAL_COMMUNITY): Payer: Self-pay

## 2017-08-11 ENCOUNTER — Ambulatory Visit (HOSPITAL_COMMUNITY)
Admission: RE | Admit: 2017-08-11 | Discharge: 2017-08-11 | Disposition: A | Discharge status: Home / Self Care | Payer: BC Managed Care – PPO | Source: Ambulatory Visit | Attending: Obstetrics and Gynecology | Admitting: Obstetrics and Gynecology

## 2017-10-07 ENCOUNTER — Ambulatory Visit: Payer: BC Managed Care – PPO | Attending: Family Medicine | Admitting: Family Medicine

## 2017-10-07 ENCOUNTER — Encounter: Payer: Self-pay | Admitting: Family Medicine

## 2017-10-07 VITALS — BP 136/80 | HR 75 | Temp 98.7°F | Resp 18 | Ht 61.0 in | Wt 175.0 lb

## 2017-10-07 DIAGNOSIS — Z8249 Family history of ischemic heart disease and other diseases of the circulatory system: Secondary | ICD-10-CM | POA: Diagnosis not present

## 2017-10-07 DIAGNOSIS — Z7982 Long term (current) use of aspirin: Secondary | ICD-10-CM | POA: Diagnosis not present

## 2017-10-07 DIAGNOSIS — I10 Essential (primary) hypertension: Secondary | ICD-10-CM

## 2017-10-07 DIAGNOSIS — Z79899 Other long term (current) drug therapy: Secondary | ICD-10-CM | POA: Diagnosis not present

## 2017-10-07 DIAGNOSIS — Z13220 Encounter for screening for lipoid disorders: Secondary | ICD-10-CM | POA: Diagnosis not present

## 2017-10-07 DIAGNOSIS — E039 Hypothyroidism, unspecified: Secondary | ICD-10-CM

## 2017-10-07 NOTE — Progress Notes (Signed)
Subjective:  Patient ID: Cassidy [name], female    DOB: 03/28/1986  Age: 32 y.o. MRN: [id number]  CC: Hypertension   HPI Cassidy Gaussngela V Self presents for to establish care for hypertension. Recently post postpartum. Reports hypertension during pregnancy. She reports taking labetolol 600 mg TID, but did not take prior to office visit. She is not exercising and is adherent to low salt diet. She does not check BP at home. Cardiac symptoms lightheadeness after taking BP medication. Patient denies chest pain, chest pressure/discomfort, dyspnea, fatigue, lower extremity edema, near-syncope, palpitations and syncope.  Cardiovascular risk factors: hypertension and sedentary lifestyle. Use of agents associated with hypertension: thyroid hormones. History of target organ damage: none. Family history of HTN-father. History of hypothyroidism. She denies any denies fatigue, weight changes, heat/cold intolerance, bowel/skin changes or CVS symptoms. Previous thyroid studies include TSH.    Outpatient Medications Prior to Visit  Medication Sig Dispense Refill  . aspirin EC 81 MG tablet Take 81 mg by mouth daily.    . cyclobenzaprine (FLEXERIL) 5 MG tablet Take 1 tablet (5 mg total) by mouth 3 (three) times daily as needed for muscle spasms. 30 tablet 0  . labetalol (NORMODYNE) 300 MG tablet Take 600 mg by mouth 3 (three) times daily.    . promethazine (PHENERGAN) 25 MG tablet Take 25 mg by mouth every 6 (six) hours as needed for nausea or vomiting.    Marland Kitchen. levothyroxine (SYNTHROID, LEVOTHROID) 50 MCG tablet Take 50 mcg by mouth daily before breakfast.    . albuterol (PROVENTIL HFA;VENTOLIN HFA) 108 (90 BASE) MCG/ACT inhaler Inhale 1-2 puffs into the lungs every 6 (six) hours as needed for wheezing. (Patient not taking: Reported on 03/23/2017) 1 Inhaler 0   No facility-administered medications prior to visit.     ROS Review of Systems  Constitutional: Negative.   HENT: Negative for trouble swallowing.     Respiratory: Negative.   Cardiovascular: Negative.   Skin: Negative.     Objective:  BP 136/80 (BP Location: Left Arm, Patient Position: Sitting, Cuff Size: Large)   Pulse 75   Temp 98.7 F (37.1 C) (Oral)   Resp 18   Ht 5\' 1"  (1.549 m)   Wt 175 lb (79.4 kg)   LMP 12/26/2016   SpO2 98%   Breastfeeding? Yes   BMI 33.07 kg/m   BP/Weight 10/07/2017 07/21/2017 03/23/2017  Systolic BP 136 120 117  Diastolic BP 80 87 80  Wt. (Lbs) 175 179 171  BMI 33.07 33.82 32.31     Physical Exam  Constitutional: She appears well-developed and well-nourished.  Eyes: Conjunctivae are normal. Pupils are equal, round, and reactive to light.  Neck: Normal range of motion. Neck supple. No JVD present. No thyromegaly present.  Cardiovascular: Normal rate, regular rhythm, normal heart sounds and intact distal pulses.  Pulmonary/Chest: Effort normal and breath sounds normal.  Abdominal: Soft. Bowel sounds are normal.  Skin: Skin is warm and dry.  Psychiatric: She has a normal mood and affect.  Nursing note and vitals reviewed.    Assessment & Plan:   1. Essential hypertension Follow up in 1 week for BP check with clinical pharmacist while on blood pressure medication to evaluate for possible hypotension.   -CMP and Liver  2. Hypothyroidism, unspecified type  - TSH - T4, Free  3. Screening cholesterol level  - Lipid Panel     Follow-up: Return in about 1 week (around 10/14/2017) for BP check with clinical pharmacist.   Lizbeth BarkMandesia R Hairston FNP

## 2017-10-07 NOTE — Patient Instructions (Addendum)
BP recheck in 1 week. Please take your blood pressure medication day of office visit.  Managing Your Hypertension Hypertension is commonly called high blood pressure. This is when the force of your blood pressing against the walls of your arteries is too strong. Arteries are blood vessels that carry blood from your heart throughout your body. Hypertension forces the heart to work harder to pump blood, and may cause the arteries to become narrow or stiff. Having untreated or uncontrolled hypertension can cause heart attack, stroke, kidney disease, and other problems. What are blood pressure readings? A blood pressure reading consists of a higher number over a lower number. Ideally, your blood pressure should be below 120/80. The first ("top") number is called the systolic pressure. It is a measure of the pressure in your arteries as your heart beats. The second ("bottom") number is called the diastolic pressure. It is a measure of the pressure in your arteries as the heart relaxes. What does my blood pressure reading mean? Blood pressure is classified into four stages. Based on your blood pressure reading, your health care provider may use the following stages to determine what type of treatment you need, if any. Systolic pressure and diastolic pressure are measured in a unit called mm Hg. Normal  Systolic pressure: below 120.  Diastolic pressure: below 80. Elevated  Systolic pressure: 120-129.  Diastolic pressure: below 80. Hypertension stage 1  Systolic pressure: 130-139.  Diastolic pressure: 80-89. Hypertension stage 2  Systolic pressure: 140 or above.  Diastolic pressure: 90 or above. What health risks are associated with hypertension? Managing your hypertension is an important responsibility. Uncontrolled hypertension can lead to:  A heart attack.  A stroke.  A weakened blood vessel (aneurysm).  Heart failure.  Kidney damage.  Eye damage.  Metabolic syndrome.  Memory  and concentration problems.  What changes can I make to manage my hypertension? Hypertension can be managed by making lifestyle changes and possibly by taking medicines. Your health care provider will help you make a plan to bring your blood pressure within a normal range. Eating and drinking  Eat a diet that is high in fiber and potassium, and low in salt (sodium), added sugar, and fat. An example eating plan is called the DASH (Dietary Approaches to Stop Hypertension) diet. To eat this way: ? Eat plenty of fresh fruits and vegetables. Try to fill half of your plate at each meal with fruits and vegetables. ? Eat whole grains, such as whole wheat pasta, brown rice, or whole grain bread. Fill about one quarter of your plate with whole grains. ? Eat low-fat diary products. ? Avoid fatty cuts of meat, processed or cured meats, and poultry with skin. Fill about one quarter of your plate with lean proteins such as fish, chicken without skin, beans, eggs, and tofu. ? Avoid premade and processed foods. These tend to be higher in sodium, added sugar, and fat.  Reduce your daily sodium intake. Most people with hypertension should eat less than 1,500 mg of sodium a day.  Limit alcohol intake to no more than 1 drink a day for nonpregnant women and 2 drinks a day for men. One drink equals 12 oz of beer, 5 oz of wine, or 1 oz of hard liquor. Lifestyle  Work with your health care provider to maintain a healthy body weight, or to lose weight. Ask what an ideal weight is for you.  Get at least 30 minutes of exercise that causes your heart to beat faster (aerobic  exercise) most days of the week. Activities may include walking, swimming, or biking.  Include exercise to strengthen your muscles (resistance exercise), such as weight lifting, as part of your weekly exercise routine. Try to do these types of exercises for 30 minutes at least 3 days a week.  Do not use any products that contain nicotine or tobacco,  such as cigarettes and e-cigarettes. If you need help quitting, ask your health care provider.  Control any long-term (chronic) conditions you have, such as high cholesterol or diabetes. Monitoring  Monitor your blood pressure at home as told by your health care provider. Your personal target blood pressure may vary depending on your medical conditions, your age, and other factors.  Have your blood pressure checked regularly, as often as told by your health care provider. Working with your health care provider  Review all the medicines you take with your health care provider because there may be side effects or interactions.  Talk with your health care provider about your diet, exercise habits, and other lifestyle factors that may be contributing to hypertension.  Visit your health care provider regularly. Your health care provider can help you create and adjust your plan for managing hypertension. Will I need medicine to control my blood pressure? Your health care provider may prescribe medicine if lifestyle changes are not enough to get your blood pressure under control, and if:  Your systolic blood pressure is 130 or higher.  Your diastolic blood pressure is 80 or higher.  Take medicines only as told by your health care provider. Follow the directions carefully. Blood pressure medicines must be taken as prescribed. The medicine does not work as well when you skip doses. Skipping doses also puts you at risk for problems. Contact a health care provider if:  You think you are having a reaction to medicines you have taken.  You have repeated (recurrent) headaches.  You feel dizzy.  You have swelling in your ankles.  You have trouble with your vision. Get help right away if:  You develop a severe headache or confusion.  You have unusual weakness or numbness, or you feel faint.  You have severe pain in your chest or abdomen.  You vomit repeatedly.  You have trouble  breathing. Summary  Hypertension is when the force of blood pumping through your arteries is too strong. If this condition is not controlled, it may put you at risk for serious complications.  Your personal target blood pressure may vary depending on your medical conditions, your age, and other factors. For most people, a normal blood pressure is less than 120/80.  Hypertension is managed by lifestyle changes, medicines, or both. Lifestyle changes include weight loss, eating a healthy, low-sodium diet, exercising more, and limiting alcohol. This information is not intended to replace advice given to you by your health care provider. Make sure you discuss any questions you have with your health care provider. Document Released: 06/14/2012 Document Revised: 08/18/2016 Document Reviewed: 08/18/2016 Elsevier Interactive Patient Education  2018 [employer]  Hypothyroidism Hypothyroidism is a disorder of the thyroid. The thyroid is a large gland that is located in the lower front of the neck. The thyroid releases hormones that control how the body works. With hypothyroidism, the thyroid does not make enough of these hormones. What are the causes? Causes of hypothyroidism may include:  Viral infections.  Pregnancy.  Your own defense system (immune system) attacking your thyroid.  Certain medicines.  Birth defects.  Past radiation treatments to your  head or neck.  Past treatment with radioactive iodine.  Past surgical removal of part or all of your thyroid.  Problems with the gland that is located in the center of your brain (pituitary).  What are the signs or symptoms? Signs and symptoms of hypothyroidism may include:  Feeling as though you have no energy (lethargy).  Inability to tolerate cold.  Weight gain that is not explained by a change in diet or exercise habits.  Dry skin.  Coarse hair.  Menstrual irregularity.  Slowing of thought  processes.  Constipation.  Sadness or depression.  How is this diagnosed? Your health care provider may diagnose hypothyroidism with blood tests and ultrasound tests. How is this treated? Hypothyroidism is treated with medicine that replaces the hormones that your body does not make. After you begin treatment, it may take several weeks for symptoms to go away. Follow these instructions at home:  Take medicines only as directed by your health care provider.  If you start taking any new medicines, tell your health care provider.  Keep all follow-up visits as directed by your health care provider. This is important. As your condition improves, your dosage needs may change. You will need to have blood tests regularly so that your health care provider can watch your condition. Contact a health care provider if:  Your symptoms do not get better with treatment.  You are taking thyroid replacement medicine and: ? You sweat excessively. ? You have tremors. ? You feel anxious. ? You lose weight rapidly. ? You cannot tolerate heat. ? You have emotional swings. ? You have diarrhea. ? You feel weak. Get help right away if:  You develop chest pain.  You develop an irregular heartbeat.  You develop a rapid heartbeat. This information is not intended to replace advice given to you by your health care provider. Make sure you discuss any questions you have with your health care provider. Document Released: 09/20/2005 Document Revised: 02/26/2016 Document Reviewed: 02/05/2014 Elsevier Interactive Patient Education  2018 [employer].

## 2017-10-08 LAB — LIPID PANEL
CHOLESTEROL TOTAL: 195 mg/dL (ref 100–199)
Chol/HDL Ratio: 4.9 ratio — ABNORMAL HIGH (ref 0.0–4.4)
HDL: 40 mg/dL (ref >39)
LDL Calculated: 137 mg/dL — ABNORMAL HIGH (ref 0–99)
TRIGLYCERIDES: 90 mg/dL (ref 0–149)
VLDL CHOLESTEROL CAL: 18 mg/dL (ref 5–40)

## 2017-10-08 LAB — CMP AND LIVER
ALK PHOS: 76 IU/L (ref 39–117)
ALT: 20 IU/L (ref 0–32)
AST: 22 IU/L (ref 0–40)
Albumin: 4.7 g/dL (ref 3.5–5.5)
BILIRUBIN, DIRECT: 0.05 mg/dL (ref 0.00–0.40)
BUN: 9 mg/dL (ref 6–20)
Bilirubin Total: <0.2 mg/dL (ref 0.0–1.2)
CALCIUM: 9.4 mg/dL (ref 8.7–10.2)
CO2: 23 mmol/L (ref 20–29)
Chloride: 101 mmol/L (ref 96–106)
Creatinine, Ser: 0.78 mg/dL (ref 0.57–1.00)
GFR calc Af Amer: 116 mL/min/{1.73_m2} (ref >59)
GFR calc non Af Amer: 100 mL/min/{1.73_m2} (ref >59)
Glucose: 78 mg/dL (ref 65–99)
Potassium: 4 mmol/L (ref 3.5–5.2)
Sodium: 140 mmol/L (ref 134–144)
Total Protein: 8.1 g/dL (ref 6.0–8.5)

## 2017-10-08 LAB — TSH: TSH: 1.99 u[IU]/mL (ref 0.450–4.500)

## 2017-10-08 LAB — T4, FREE: Free T4: 0.77 ng/dL — ABNORMAL LOW (ref 0.82–1.77)

## 2017-10-12 ENCOUNTER — Other Ambulatory Visit: Payer: Self-pay | Admitting: Family Medicine

## 2017-10-12 DIAGNOSIS — E039 Hypothyroidism, unspecified: Secondary | ICD-10-CM

## 2017-10-12 MED ORDER — LEVOTHYROXINE SODIUM 50 MCG PO TABS: 50.0000 ug | ORAL_TABLET | Freq: Every day | ORAL | 2 refills | Status: DC

## 2017-10-18 ENCOUNTER — Ambulatory Visit: Payer: BC Managed Care – PPO | Admitting: Pharmacist

## 2017-10-28 ENCOUNTER — Telehealth: Payer: Self-pay | Admitting: *Deleted

## 2017-10-28 NOTE — Telephone Encounter (Signed)
-----   Message from Lizbeth BarkMandesia R Hairston, FNP sent at 10/12/2017  2:20 PM EST ----- LDL cholesterol levels elevated. This can increase your risk of heart disease overtime. Start eating a diet lower in saturated fat. Limit your intake of fried foods, red meats, and whole milk. Increase physical activity.  TSH normal. Labs normal. Liver function normal Kidney function normal

## 2017-10-28 NOTE — Telephone Encounter (Signed)
Patient verified DOB Patient is aware of cholesterol being elevated and needing to implement some nutritional changes. Patient is aware of all other labs being normal and kidney and liver function being normal. Patient transferred to the front to reschedule missed follow up appointment. No further questions at this time.

## 2017-11-14 ENCOUNTER — Telehealth: Payer: Self-pay | Admitting: Family Medicine

## 2017-11-14 ENCOUNTER — Other Ambulatory Visit: Payer: Self-pay | Admitting: Family Medicine

## 2017-11-14 DIAGNOSIS — E039 Hypothyroidism, unspecified: Secondary | ICD-10-CM

## 2017-11-14 MED ORDER — LEVOTHYROXINE SODIUM 50 MCG PO TABS: 50.0000 ug | ORAL_TABLET | Freq: Every day | ORAL | 0 refills | Status: DC

## 2017-11-14 NOTE — Telephone Encounter (Signed)
Refill for 30 day supply only. No additional refill without office visit. Recommend she schedule office visit for follow up.

## 2017-11-14 NOTE — Telephone Encounter (Signed)
Pt called to request a refill on  -levothyroxine (SYNTHROID, LEVOTHROID) 50 MCG tablet  -labetalol (NORMODYNE) 300 MG tablet  If approved please send to  -[id number] - [place], [place] - [address] Please follow up

## 2017-11-18 ENCOUNTER — Telehealth: Payer: Self-pay | Admitting: Family Medicine

## 2017-11-18 ENCOUNTER — Other Ambulatory Visit: Payer: Self-pay | Admitting: Family Medicine

## 2017-11-18 NOTE — Telephone Encounter (Signed)
Called and spoke with patient regarding medication refill. Recommend she been seen in office. Will add to schedule for Monday. She reports having medication available.

## 2017-11-18 NOTE — Telephone Encounter (Signed)
Pt wants to know if she can be refilled on the  -labetalol (NORMODYNE) 300 MG tablet  She says she really needs this medication until next office visit If approved please send to  -[id number] - [place], [place] - [address] Please follow up

## 2017-11-18 NOTE — Telephone Encounter (Signed)
Called and spoke with patient. Recommend follow up in office. Please add to afternoon schedule for Monday 2/18 2:45 pm or double book for 2:50 pm .

## 2017-11-21 ENCOUNTER — Encounter: Payer: Self-pay | Admitting: Family Medicine

## 2017-11-21 ENCOUNTER — Ambulatory Visit: Payer: BC Managed Care – PPO | Attending: Family Medicine | Admitting: Family Medicine

## 2017-11-21 VITALS — BP 101/70 | HR 82 | Temp 98.1°F | Ht 61.0 in | Wt 178.6 lb

## 2017-11-21 DIAGNOSIS — I10 Essential (primary) hypertension: Secondary | ICD-10-CM | POA: Diagnosis not present

## 2017-11-21 DIAGNOSIS — Z8249 Family history of ischemic heart disease and other diseases of the circulatory system: Secondary | ICD-10-CM | POA: Insufficient documentation

## 2017-11-21 DIAGNOSIS — Z760 Encounter for issue of repeat prescription: Secondary | ICD-10-CM | POA: Insufficient documentation

## 2017-11-21 DIAGNOSIS — Z79890 Hormone replacement therapy (postmenopausal): Secondary | ICD-10-CM | POA: Insufficient documentation

## 2017-11-21 DIAGNOSIS — Z79899 Other long term (current) drug therapy: Secondary | ICD-10-CM | POA: Insufficient documentation

## 2017-11-21 DIAGNOSIS — Z7982 Long term (current) use of aspirin: Secondary | ICD-10-CM | POA: Insufficient documentation

## 2017-11-21 MED ORDER — LABETALOL HCL 300 MG PO TABS: 600.0000 mg | ORAL_TABLET | Freq: Three times a day (TID) | ORAL | 2 refills | Status: DC

## 2017-11-21 NOTE — Progress Notes (Signed)
   Subjective:  Patient ID: Cassidy GaussAngela V Lotz, female    DOB: 11/07/1985  Age: 32 y.o. MRN: [id number]  CC: Hypertension and Medication Refill   HPI Cassidy [name] presents for hypertension and medication refills. She reports taking labetolol 600 mg TID. She has taken antihypertensives prior to office visit.  She is not exercising and is adherent to low salt diet. She c/o of occasional lightheadeness in the am after taking antihypertensives mediations. She does not check BP at home. Patient denies chest pain, chest pressure/discomfort, dyspnea, fatigue, lower extremity edema, near-syncope, palpitations and syncope.  Cardiovascular risk factors: hypertension and sedentary lifestyle. Use of agents associated with hypertension: thyroid hormones. History of target organ damage: none. Family history of HTN-father.   Outpatient Medications Prior to Visit  Medication Sig Dispense Refill  . levothyroxine (SYNTHROID, LEVOTHROID) 50 MCG tablet Take 1 tablet (50 mcg total) by mouth daily before breakfast. 30 tablet 0  . labetalol (NORMODYNE) 300 MG tablet Take 600 mg by mouth 3 (three) times daily.    Marland Kitchen. albuterol (PROVENTIL HFA;VENTOLIN HFA) 108 (90 BASE) MCG/ACT inhaler Inhale 1-2 puffs into the lungs every 6 (six) hours as needed for wheezing. (Patient not taking: Reported on 03/23/2017) 1 Inhaler 0  . aspirin EC 81 MG tablet Take 81 mg by mouth daily.    . cyclobenzaprine (FLEXERIL) 5 MG tablet Take 1 tablet (5 mg total) by mouth 3 (three) times daily as needed for muscle spasms. (Patient not taking: Reported on 11/21/2017) 30 tablet 0  . promethazine (PHENERGAN) 25 MG tablet Take 25 mg by mouth every 6 (six) hours as needed for nausea or vomiting.     No facility-administered medications prior to visit.      Review of Systems  Constitutional: Negative.   Respiratory: Negative.   Cardiovascular: Negative.   Gastrointestinal: Negative.   Skin: Negative.     Objective:  BP 101/70   Pulse 82   Temp  98.1 F (36.7 C) (Oral)   Ht 5\' 1"  (1.549 m)   Wt 178 lb 9.6 oz (81 kg)   LMP 12/26/2016   SpO2 98%   BMI 33.75 kg/m   BP/Weight 11/21/2017 10/07/2017 07/21/2017  Systolic BP 101 136 120  Diastolic BP 70 80 87  Wt. (Lbs) 178.6 175 179  BMI 33.75 33.07 33.82    Physical Exam  Nursing note and vitals reviewed. Constitutional: She appears well-developed and well-nourished.  Cardiovascular: Normal rate, regular rhythm, normal heart sounds and intact distal pulses.  Respiratory: Effort normal and breath sounds normal.  GI: Soft. Bowel sounds are normal.  Skin: Skin is warm and dry.  Psychiatric: She has a normal mood and affect.     Assessment & Plan:   1. HTN, goal below 130/80 -Recommend  check BP prior taking BP medication in the am. - labetalol (NORMODYNE) 300 MG tablet; Take 2 tablets (600 mg total) by mouth 3 (three) times daily.    Follow-up: Return in about 3 months (around 02/18/2018) for HTN .   Lizbeth BarkMandesia R Johara Lodwick FNP

## 2017-11-21 NOTE — Patient Instructions (Signed)
Managing Your Hypertension Hypertension is commonly called high blood pressure. This is when the force of your blood pressing against the walls of your arteries is too strong. Arteries are blood vessels that carry blood from your heart throughout your body. Hypertension forces the heart to work harder to pump blood, and may cause the arteries to become narrow or stiff. Having untreated or uncontrolled hypertension can cause heart attack, stroke, kidney disease, and other problems. What are blood pressure readings? A blood pressure reading consists of a higher number over a lower number. Ideally, your blood pressure should be below 120/80. The first ("top") number is called the systolic pressure. It is a measure of the pressure in your arteries as your heart beats. The second ("bottom") number is called the diastolic pressure. It is a measure of the pressure in your arteries as the heart relaxes. What does my blood pressure reading mean? Blood pressure is classified into four stages. Based on your blood pressure reading, your health care provider may use the following stages to determine what type of treatment you need, if any. Systolic pressure and diastolic pressure are measured in a unit called mm Hg. Normal  Systolic pressure: below 120.  Diastolic pressure: below 80. Elevated  Systolic pressure: 120-129.  Diastolic pressure: below 80. Hypertension stage 1  Systolic pressure: 130-139.  Diastolic pressure: 80-89. Hypertension stage 2  Systolic pressure: 140 or above.  Diastolic pressure: 90 or above. What health risks are associated with hypertension? Managing your hypertension is an important responsibility. Uncontrolled hypertension can lead to:  A heart attack.  A stroke.  A weakened blood vessel (aneurysm).  Heart failure.  Kidney damage.  Eye damage.  Metabolic syndrome.  Memory and concentration problems.  What changes can I make to manage my  hypertension? Hypertension can be managed by making lifestyle changes and possibly by taking medicines. Your health care provider will help you make a plan to bring your blood pressure within a normal range. Eating and drinking  Eat a diet that is high in fiber and potassium, and low in salt (sodium), added sugar, and fat. An example eating plan is called the DASH (Dietary Approaches to Stop Hypertension) diet. To eat this way: ? Eat plenty of fresh fruits and vegetables. Try to fill half of your plate at each meal with fruits and vegetables. ? Eat whole grains, such as whole wheat pasta, brown rice, or whole grain bread. Fill about one quarter of your plate with whole grains. ? Eat low-fat diary products. ? Avoid fatty cuts of meat, processed or cured meats, and poultry with skin. Fill about one quarter of your plate with lean proteins such as fish, chicken without skin, beans, eggs, and tofu. ? Avoid premade and processed foods. These tend to be higher in sodium, added sugar, and fat.  Reduce your daily sodium intake. Most people with hypertension should eat less than 1,500 mg of sodium a day.  Limit alcohol intake to no more than 1 drink a day for nonpregnant women and 2 drinks a day for men. One drink equals 12 oz of beer, 5 oz of wine, or 1 oz of hard liquor. Lifestyle  Work with your health care provider to maintain a healthy body weight, or to lose weight. Ask what an ideal weight is for you.  Get at least 30 minutes of exercise that causes your heart to beat faster (aerobic exercise) most days of the week. Activities may include walking, swimming, or biking.  Include exercise   to strengthen your muscles (resistance exercise), such as weight lifting, as part of your weekly exercise routine. Try to do these types of exercises for 30 minutes at least 3 days a week.  Do not use any products that contain nicotine or tobacco, such as cigarettes and e-cigarettes. If you need help quitting, ask  your health care provider.  Control any long-term (chronic) conditions you have, such as high cholesterol or diabetes. Monitoring  Monitor your blood pressure at home as told by your health care provider. Your personal target blood pressure may vary depending on your medical conditions, your age, and other factors.  Have your blood pressure checked regularly, as often as told by your health care provider. Working with your health care provider  Review all the medicines you take with your health care provider because there may be side effects or interactions.  Talk with your health care provider about your diet, exercise habits, and other lifestyle factors that may be contributing to hypertension.  Visit your health care provider regularly. Your health care provider can help you create and adjust your plan for managing hypertension. Will I need medicine to control my blood pressure? Your health care provider may prescribe medicine if lifestyle changes are not enough to get your blood pressure under control, and if:  Your systolic blood pressure is 130 or higher.  Your diastolic blood pressure is 80 or higher.  Take medicines only as told by your health care provider. Follow the directions carefully. Blood pressure medicines must be taken as prescribed. The medicine does not work as well when you skip doses. Skipping doses also puts you at risk for problems. Contact a health care provider if:  You think you are having a reaction to medicines you have taken.  You have repeated (recurrent) headaches.  You feel dizzy.  You have swelling in your ankles.  You have trouble with your vision. Get help right away if:  You develop a severe headache or confusion.  You have unusual weakness or numbness, or you feel faint.  You have severe pain in your chest or abdomen.  You vomit repeatedly.  You have trouble breathing. Summary  Hypertension is when the force of blood pumping through  your arteries is too strong. If this condition is not controlled, it may put you at risk for serious complications.  Your personal target blood pressure may vary depending on your medical conditions, your age, and other factors. For most people, a normal blood pressure is less than 120/80.  Hypertension is managed by lifestyle changes, medicines, or both. Lifestyle changes include weight loss, eating a healthy, low-sodium diet, exercising more, and limiting alcohol. This information is not intended to replace advice given to you by your health care provider. Make sure you discuss any questions you have with your health care provider. Document Released: 06/14/2012 Document Revised: 08/18/2016 Document Reviewed: 08/18/2016 Elsevier Interactive Patient Education  2018 [employer]  

## 2017-11-23 ENCOUNTER — Other Ambulatory Visit: Payer: Self-pay | Admitting: Pharmacist

## 2017-11-23 DIAGNOSIS — I10 Essential (primary) hypertension: Secondary | ICD-10-CM

## 2017-11-23 MED ORDER — LABETALOL HCL 300 MG PO TABS: 600.0000 mg | ORAL_TABLET | Freq: Three times a day (TID) | ORAL | 2 refills | Status: DC

## 2017-12-15 ENCOUNTER — Ambulatory Visit: Payer: BC Managed Care – PPO | Admitting: Internal Medicine

## 2018-01-12 ENCOUNTER — Encounter: Payer: Self-pay | Admitting: Emergency Medicine

## 2018-01-12 ENCOUNTER — Emergency Department
Admission: EM | Admit: 2018-01-12 | Discharge: 2018-01-12 | Disposition: A | Discharge status: Home / Self Care | Payer: BC Managed Care – PPO | Attending: Emergency Medicine | Admitting: Emergency Medicine

## 2018-01-12 DIAGNOSIS — R519 Headache, unspecified: Secondary | ICD-10-CM

## 2018-01-12 DIAGNOSIS — R51 Headache: Secondary | ICD-10-CM | POA: Diagnosis not present

## 2018-01-12 DIAGNOSIS — Z91040 Latex allergy status: Secondary | ICD-10-CM | POA: Diagnosis not present

## 2018-01-12 DIAGNOSIS — I10 Essential (primary) hypertension: Secondary | ICD-10-CM | POA: Diagnosis not present

## 2018-01-12 DIAGNOSIS — E039 Hypothyroidism, unspecified: Secondary | ICD-10-CM | POA: Diagnosis not present

## 2018-01-12 LAB — CBC WITH DIFFERENTIAL/PLATELET
Basophils Absolute: 0 10*3/uL (ref 0–0.1)
Basophils Relative: 0 %
Eosinophils Absolute: 0.1 10*3/uL (ref 0–0.7)
Eosinophils Relative: 1 %
HEMATOCRIT: 31.9 % — AB (ref 35.0–47.0)
HEMOGLOBIN: 10.2 g/dL — AB (ref 12.0–16.0)
LYMPHS ABS: 0.9 10*3/uL — AB (ref 1.0–3.6)
LYMPHS PCT: 12 %
MCH: 25.5 pg — AB (ref 26.0–34.0)
MCHC: 32.1 g/dL (ref 32.0–36.0)
MCV: 79.4 fL — AB (ref 80.0–100.0)
MONO ABS: 0.3 10*3/uL (ref 0.2–0.9)
MONOS PCT: 4 %
NEUTROS ABS: 6.5 10*3/uL (ref 1.4–6.5)
NEUTROS PCT: 83 %
Platelets: 304 10*3/uL (ref 150–440)
RBC: 4.02 MIL/uL (ref 3.80–5.20)
RDW: 18 % — AB (ref 11.5–14.5)
WBC: 7.8 10*3/uL (ref 3.6–11.0)

## 2018-01-12 LAB — COMPREHENSIVE METABOLIC PANEL
ALBUMIN: 3.9 g/dL (ref 3.5–5.0)
ALK PHOS: 55 U/L (ref 38–126)
ALT: 15 U/L (ref 14–54)
ANION GAP: 5 (ref 5–15)
AST: 20 U/L (ref 15–41)
BILIRUBIN TOTAL: 0.4 mg/dL (ref 0.3–1.2)
BUN: 9 mg/dL (ref 6–20)
CALCIUM: 8.7 mg/dL — AB (ref 8.9–10.3)
CO2: 24 mmol/L (ref 22–32)
Chloride: 108 mmol/L (ref 101–111)
Creatinine, Ser: 0.82 mg/dL (ref 0.44–1.00)
GFR calc Af Amer: >60 mL/min (ref >60)
GLUCOSE: 117 mg/dL — AB (ref 65–99)
POTASSIUM: 3.3 mmol/L — AB (ref 3.5–5.1)
Sodium: 137 mmol/L (ref 135–145)
TOTAL PROTEIN: 8 g/dL (ref 6.5–8.1)

## 2018-01-12 MED ORDER — METOCLOPRAMIDE HCL 5 MG/ML IJ SOLN
10.0000 mg | Freq: Once | INTRAMUSCULAR | Status: AC
  Administered 2018-01-12: 10 mg via INTRAVENOUS
  Filled 2018-01-12: qty 2

## 2018-01-12 MED ORDER — DIPHENHYDRAMINE HCL 50 MG/ML IJ SOLN
25.0000 mg | Freq: Once | INTRAMUSCULAR | Status: AC
  Administered 2018-01-12: 25 mg via INTRAVENOUS
  Filled 2018-01-12: qty 1

## 2018-01-12 MED ORDER — BUTALBITAL-APAP-CAFFEINE 50-325-40 MG PO TABS: 1.0000 | ORAL_TABLET | Freq: Four times a day (QID) | ORAL | 0 refills | Status: AC | PRN

## 2018-01-12 MED ORDER — SODIUM CHLORIDE 0.9 % IV SOLN
Freq: Once | INTRAVENOUS | Status: AC
  Administered 2018-01-12: 22:00:00 via INTRAVENOUS

## 2018-01-12 MED ORDER — KETOROLAC TROMETHAMINE 30 MG/ML IJ SOLN
30.0000 mg | Freq: Once | INTRAMUSCULAR | Status: AC
  Administered 2018-01-12: 30 mg via INTRAVENOUS
  Filled 2018-01-12: qty 1

## 2018-01-12 NOTE — ED Triage Notes (Signed)
Pt comes into the ED via POV c/o migraine, photosensitivity and nausea.  Patient has h/o severe headaches and has used the home medications she normally takes but has had no relief.  Patient in NAD at this time and is neurologically intact.  Patient denies any weakness, shortness of breath or chest pain.

## 2018-01-12 NOTE — ED Notes (Signed)
Patient discharged to home per MD order. Patient in stable condition, and deemed medically cleared by ED provider for discharge. Discharge instructions reviewed with patient/family using "Teach Back"; verbalized understanding of medication education and administration, and information about follow-up care. Denies further concerns. ° °

## 2018-01-12 NOTE — ED Provider Notes (Signed)
[facility] Emergency Department Provider Note       Time seen: ----------------------------------------- 9:57 PM on 01/12/2018 -----------------------------------------   I have reviewed the triage vital signs and the nursing notes.  HISTORY   Chief Complaint Migraine    HPI Cassidy [name] is a 32 y.o. female with a history of hypertension, pneumonia, pregnancy-induced hypertension, preeclampsia with her previous pregnancy who presents to the ED for persistent headache.  Headache is diffuse, she has had photosensitivity and nausea and vomiting x3.  She does have a history of severe headaches but is used her home medications without any improvement.  Patient states this is not the worst headache of her life, she denies any neurologic symptoms.  She denies fevers, chills or other complaints.  Past Medical History:  Diagnosis Date  . 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 HSV infection   . Hypertension   . Hypothyroidism   . Pneumonia   . Pregnancy induced hypertension   . Vaginal Pap smear, abnormal     Patient Active Problem List   Diagnosis Date Noted  . Hypothyroidism 10/12/2017  . Dysplasia of cervix, low grade (CIN 1)   . High risk HPV infection     Past Surgical History:  Procedure Laterality Date  . BREAST BIOPSY    . CESAREAN SECTION    . COLPOSCOPY  2005  . thyroid ablation  2017    Allergies Latex  Social History Social History   Tobacco Use  . Smoking status: Never Smoker  . Smokeless tobacco: Never Used  Substance Use Topics  . Alcohol use: No  . Drug use: No   Review of Systems Constitutional: Negative for fever. Eyes: Negative for vision changes, positive for mild photophobia ENT:  Negative for congestion, sore throat Cardiovascular: Negative for chest pain. Respiratory: Negative for shortness of breath. Gastrointestinal: Negative for  abdominal pain, positive for nausea vomiting Musculoskeletal: Negative for back pain. Skin: Negative for rash. Neurological: Positive for headache  All systems negative/normal/unremarkable except as stated in the HPI  ____________________________________________   PHYSICAL EXAM:  VITAL SIGNS: ED Triage Vitals [01/12/18 2027]  Enc Vitals Group     BP (!) 136/91     Pulse Rate 84     Resp 17     Temp 98.4 F (36.9 C)     Temp Source Oral     SpO2 99 %     Weight 174 lb (78.9 kg)     Height 5\' 1"  (1.549 m)     Head Circumference      Peak Flow      Pain Score 10     Pain Loc      Pain Edu?      Excl. in GC?    Constitutional: Alert and oriented. Well appearing and in no distress. Eyes: Conjunctivae are normal. Normal extraocular movements.  Mild photophobia, pupils equal round and reactive to light ENT   Head: Normocephalic and atraumatic.   Nose: No congestion/rhinnorhea.   Mouth/Throat: Mucous membranes are moist.   Neck: No stridor. Cardiovascular: Normal rate, regular rhythm. No murmurs, rubs, or gallops. Respiratory: Normal respiratory effort without tachypnea nor retractions. Breath sounds are clear and equal bilaterally. No wheezes/rales/rhonchi. Gastrointestinal: Soft and nontender. Normal bowel sounds Musculoskeletal: Nontender with normal range of motion in extremities. No lower extremity tenderness nor edema. Neurologic:  Normal speech and language. No gross  focal neurologic deficits are appreciated.  Strength, sensation, cranial nerves are normal Skin:  Skin is warm, dry and intact. No rash noted. Psychiatric: Mood and affect are normal. Speech and behavior are normal.  ____________________________________________  ED COURSE:  As part of my medical decision making, I reviewed the following data within the electronic MEDICAL RECORD NUMBER History obtained from family if available, nursing notes, old chart and ekg, as well as notes from prior ED  visits. Patient presented for persistent headache, we will assess with labs and given IV headache cocktail.    Procedures ____________________________________________   LABS (pertinent positives/negatives)  Labs Reviewed  CBC WITH DIFFERENTIAL/PLATELET  COMPREHENSIVE METABOLIC PANEL   ____________________________________________  DIFFERENTIAL DIAGNOSIS   Migraine, tension headache, dehydration, electrolyte abnormality, hypertension  FINAL ASSESSMENT AND PLAN  Headache   Plan: The patient had presented for persistent headache. Patient's labs were unremarkable.  Patient had dramatic improvement in her headache, we will try a prescription for Fioricet and close outpatient follow-up with her doctor.Ulice Dash, MD   Note: This note was generated in part or whole with voice recognition software. Voice recognition is usually quite accurate but there are transcription errors that can and very often do occur. I apologize for any typographical errors that were not detected and corrected.     Emily Filbert, MD 01/12/18 [phone]

## 2018-03-28 ENCOUNTER — Ambulatory Visit: Payer: BC Managed Care – PPO | Admitting: Women's Health

## 2018-03-28 ENCOUNTER — Encounter: Payer: Self-pay | Admitting: Women's Health

## 2018-03-28 VITALS — BP 124/80 | Ht 61.0 in | Wt 184.0 lb

## 2018-03-28 DIAGNOSIS — Z01419 Encounter for gynecological examination (general) (routine) without abnormal findings: Secondary | ICD-10-CM

## 2018-03-28 DIAGNOSIS — E039 Hypothyroidism, unspecified: Secondary | ICD-10-CM

## 2018-03-28 MED ORDER — LEVOTHYROXINE SODIUM 50 MCG PO TABS: 50.0000 ug | ORAL_TABLET | Freq: Every day | ORAL | 4 refills | Status: DC

## 2018-03-28 NOTE — Progress Notes (Signed)
Cassidy [name] 02/11/1986 [id number]    History:    Presents for annual exam. Unsure of last pap date/No records of pap. 2018 Tubal ligation. Monthly cycles with moderate bleeding, Gardasil series completed.   Past medical history, past surgical history, family history and social history were all reviewed and documented in the EPIC chart. Mother of 34 year old and 607 month old. Works in administration at [employer]. Hypertension, pregnancy-induced in 2018, on labetolol 600 mg three times daily. Migraines, fiorocet 50-325-40 mg, 1-2 tablets as needed every 6 hours, no additional episodes since ED visit 01/2018. Hypothyroidism, synthroid 50mcg daily. Husband is a [employer] therapistcarp[employer]. Mother with diabetes. Father with hypertension. Paternal grandmother with breast cancer.  ROS:  A ROS was performed and pertinent positives and negatives are included.  Exam:  Vitals:   03/28/18 1225  BP: 124/80  Weight: 184 lb (83.5 kg)  Height: 5\' 1"  (1.549 m)   Body mass index is 34.77 kg/m.   General appearance:  Normal Thyroid:  Symmetrical, normal in size, without palpable masses or nodularity. Respiratory  Auscultation:  Clear without wheezing or rhonchi Cardiovascular  Auscultation:  Regular rate, without rubs, murmurs or gallops  Edema/varicosities:  Not grossly evident Abdominal  Soft,nontender, without masses, guarding or rebound.  Liver/spleen:  No organomegaly noted  Hernia:  None appreciated  Skin  Inspection:  Grossly normal   Breasts: Examined lying and sitting.     Right: Without masses, retractions, discharge or axillary adenopathy.     Left: Without masses, retractions, discharge or axillary adenopathy. Gentitourinary   Inguinal/mons:  Normal without inguinal adenopathy  External genitalia:  Normal  BUS/Urethra/Skene's glands:  Normal  Vagina:  Normal  Cervix:  Normal  Uterus:  Normal in size, shape and contour.  Midline and mobile  Adnexa/parametria:     Rt: Without masses or  tenderness.   Lt: Without masses or tenderness.  Anus and perineum: Normal  Assessment/Plan:  32 y.o. MBF G3P2 for annual exam with no complaints.  Monthly cycles with moderate bleeding/BTL Hypertension, migraines-primary care manages labs and meds Obesity  Plan: Discussed contraception options to manage menstrual bleeding if needed. Refill synthroid 50mcg daily, quantity 90 with 4 refills. Pap with HPV. TSH, glucose, CBC, and urinalysis. SBEs, low carb diet, and exercise reviewed. Return to clinic in 1 year or sooner if indicated.  Harrington Challengerancy J Latriece Anstine [facility] GermanWHNP, 1:04 PM 03/28/2018

## 2018-03-28 NOTE — Patient Instructions (Signed)
Carbohydrate Counting for Diabetes Mellitus, Adult Carbohydrate counting is a method for keeping track of how many carbohydrates you eat. Eating carbohydrates naturally increases the amount of sugar (glucose) in the blood. Counting how many carbohydrates you eat helps keep your blood glucose within normal limits, which helps you manage your diabetes (diabetes mellitus). It is important to know how many carbohydrates you can safely have in each meal. This is different for every person. A diet and nutrition specialist (registered dietitian) can help you make a meal plan and calculate how many carbohydrates you should have at each meal and snack. Carbohydrates are found in the following foods:  Grains, such as breads and cereals.  Dried beans and soy products.  Starchy vegetables, such as potatoes, peas, and corn.  Fruit and fruit juices.  Milk and yogurt.  Sweets and snack foods, such as cake, cookies, candy, chips, and soft drinks.  How do I count carbohydrates? There are two ways to count carbohydrates in food. You can use either of the methods or a combination of both. Reading "Nutrition Facts" on packaged food The "Nutrition Facts" list is included on the labels of almost all packaged foods and beverages in the [place] It includes:  The serving size.  Information about nutrients in each serving, including the grams (g) of carbohydrate per serving.  To use the "Nutrition Facts":  Decide how many servings you will have.  Multiply the number of servings by the number of carbohydrates per serving.  The resulting number is the total amount of carbohydrates that you will be having.  Learning standard serving sizes of other foods When you eat foods containing carbohydrates that are not packaged or do not include "Nutrition Facts" on the label, you need to measure the servings in order to count the amount of carbohydrates:  Measure the foods that you will eat with a food scale or  measuring cup, if needed.  Decide how many standard-size servings you will eat.  Multiply the number of servings by 15. Most carbohydrate-rich foods have about 15 g of carbohydrates per serving. ? For example, if you eat 8 oz (170 g) of strawberries, you will have eaten 2 servings and 30 g of carbohydrates (2 servings x 15 g = 30 g).  For foods that have more than one food mixed, such as soups and casseroles, you must count the carbohydrates in each food that is included.  The following list contains standard serving sizes of common carbohydrate-rich foods. Each of these servings has about 15 g of carbohydrates:   hamburger bun or  English muffin.   oz (15 mL) syrup.   oz (14 g) jelly.  1 slice of bread.  1 six-inch tortilla.  3 oz (85 g) cooked rice or pasta.  4 oz (113 g) cooked dried beans.  4 oz (113 g) starchy vegetable, such as peas, corn, or potatoes.  4 oz (113 g) hot cereal.  4 oz (113 g) mashed potatoes or  of a large baked potato.  4 oz (113 g) canned or frozen fruit.  4 oz (120 mL) fruit juice.  4-6 crackers.  6 chicken nuggets.  6 oz (170 g) unsweetened dry cereal.  6 oz (170 g) plain fat-free yogurt or yogurt sweetened with artificial sweeteners.  8 oz (240 mL) milk.  8 oz (170 g) fresh fruit or one small piece of fruit.  24 oz (680 g) popped popcorn.  Example of carbohydrate counting Sample meal  3 oz (85 g) chicken breast.    6 oz (170 g) brown rice.  4 oz (113 g) corn.  8 oz (240 mL) milk.  8 oz (170 g) strawberries with sugar-free whipped topping. Carbohydrate calculation 1. Identify the foods that contain carbohydrates: ? Rice. ? Corn. ? Milk. ? Strawberries. 2. Calculate how many servings you have of each food: ? 2 servings rice. ? 1 serving corn. ? 1 serving milk. ? 1 serving strawberries. 3. Multiply each number of servings by 15 g: ? 2 servings rice x 15 g = 30 g. ? 1 serving corn x 15 g = 15 g. ? 1 serving milk x 15  g = 15 g. ? 1 serving strawberries x 15 g = 15 g. 4. Add together all of the amounts to find the total grams of carbohydrates eaten: ? 30 g + 15 g + 15 g + 15 g = 75 g of carbohydrates total. This information is not intended to replace advice given to you by your health care provider. Make sure you discuss any questions you have with your health care provider. Document Released: 09/20/2005 Document Revised: 04/09/2016 Document Reviewed: 03/03/2016 Elsevier Interactive Patient Education  2018 [place] Maintenance, Female Adopting a healthy lifestyle and getting preventive care can go a long way to promote health and wellness. Talk with your health care provider about what schedule of regular examinations is right for you. This is a good chance for you to check in with your provider about disease prevention and staying healthy. In between checkups, there are plenty of things you can do on your own. Experts have done a lot of research about which lifestyle changes and preventive measures are most likely to keep you healthy. Ask your health care provider for more information. Weight and diet Eat a healthy diet  Be sure to include plenty of vegetables, fruits, low-fat dairy products, and lean protein.  Do not eat a lot of foods high in solid fats, added sugars, or salt.  Get regular exercise. This is one of the most important things you can do for your health. ? Most adults should exercise for at least 150 minutes each week. The exercise should increase your heart rate and make you sweat (moderate-intensity exercise). ? Most adults should also do strengthening exercises at least twice a week. This is in addition to the moderate-intensity exercise.  Maintain a healthy weight  Body mass index (BMI) is a measurement that can be used to identify possible weight problems. It estimates body fat based on height and weight. Your health care provider can help determine your BMI and help you  achieve or maintain a healthy weight.  For females 76 years of age and older: ? A BMI below 18.5 is considered underweight. ? A BMI of 18.5 to 24.9 is normal. ? A BMI of 25 to 29.9 is considered overweight. ? A BMI of 30 and above is considered obese.  Watch levels of cholesterol and blood lipids  You should start having your blood tested for lipids and cholesterol at 34 years of age, then have this test every 5 years.  You may need to have your cholesterol levels checked more often if: ? Your lipid or cholesterol levels are high. ? You are older than 34 years of age. ? You are at high risk for heart disease.  Cancer screening Lung Cancer  Lung cancer screening is recommended for adults 52-25 years old who are at high risk for lung cancer because of a history of smoking.  A  yearly low-dose CT scan of the lungs is recommended for people who: ? Currently smoke. ? Have quit within the past 15 years. ? Have at least a 30-pack-year history of smoking. A pack year is smoking an average of one pack of cigarettes a day for 1 year.  Yearly screening should continue until it has been 15 years since you quit.  Yearly screening should stop if you develop a health problem that would prevent you from having lung cancer treatment.  Breast Cancer  Practice breast self-awareness. This means understanding how your breasts normally appear and feel.  It also means doing regular breast self-exams. Let your health care provider know about any changes, no matter how small.  If you are in your 20s or 30s, you should have a clinical breast exam (CBE) by a health care provider every 1-3 years as part of a regular health exam.  If you are 4 or older, have a CBE every year. Also consider having a breast X-ray (mammogram) every year.  If you have a family history of breast cancer, talk to your health care provider about genetic screening.  If you are at high risk for breast cancer, talk to your health  care provider about having an MRI and a mammogram every year.  Breast cancer gene (BRCA) assessment is recommended for women who have family members with BRCA-related cancers. BRCA-related cancers include: ? Breast. ? Ovarian. ? Tubal. ? Peritoneal cancers.  Results of the assessment will determine the need for genetic counseling and BRCA1 and BRCA2 testing.  Cervical Cancer Your health care provider may recommend that you be screened regularly for cancer of the pelvic organs (ovaries, uterus, and vagina). This screening involves a pelvic examination, including checking for microscopic changes to the surface of your cervix (Pap test). You may be encouraged to have this screening done every 3 years, beginning at age 12.  For women ages 13-65, health care providers may recommend pelvic exams and Pap testing every 3 years, or they may recommend the Pap and pelvic exam, combined with testing for human papilloma virus (HPV), every 5 years. Some types of HPV increase your risk of cervical cancer. Testing for HPV may also be done on women of any age with unclear Pap test results.  Other health care providers may not recommend any screening for nonpregnant women who are considered low risk for pelvic cancer and who do not have symptoms. Ask your health care provider if a screening pelvic exam is right for you.  If you have had past treatment for cervical cancer or a condition that could lead to cancer, you need Pap tests and screening for cancer for at least 20 years after your treatment. If Pap tests have been discontinued, your risk factors (such as having a new sexual partner) need to be reassessed to determine if screening should resume. Some women have medical problems that increase the chance of getting cervical cancer. In these cases, your health care provider may recommend more frequent screening and Pap tests.  Colorectal Cancer  This type of cancer can be detected and often  prevented.  Routine colorectal cancer screening usually begins at 34 years of age and continues through 34 years of age.  Your health care provider may recommend screening at an earlier age if you have risk factors for colon cancer.  Your health care provider may also recommend using home test kits to check for hidden blood in the stool.  A small camera at the end of  a tube can be used to examine your colon directly (sigmoidoscopy or colonoscopy). This is done to check for the earliest forms of colorectal cancer.  Routine screening usually begins at age 45.  Direct examination of the colon should be repeated every 5-10 years through 34 years of age. However, you may need to be screened more often if early forms of precancerous polyps or small growths are found.  Skin Cancer  Check your skin from head to toe regularly.  Tell your health care provider about any new moles or changes in moles, especially if there is a change in a mole's shape or color.  Also tell your health care provider if you have a mole that is larger than the size of a pencil eraser.  Always use sunscreen. Apply sunscreen liberally and repeatedly throughout the day.  Protect yourself by wearing long sleeves, pants, a wide-brimmed hat, and sunglasses whenever you are outside.  Heart disease, diabetes, and high blood pressure  High blood pressure causes heart disease and increases the risk of stroke. High blood pressure is more likely to develop in: ? People who have blood pressure in the high end of the normal range (130-139/85-89 mm Hg). ? People who are overweight or obese. ? People who are African American.  If you are 65-44 years of age, have your blood pressure checked every 3-5 years. If you are 35 years of age or older, have your blood pressure checked every year. You should have your blood pressure measured twice-once when you are at a hospital or clinic, and once when you are not at a hospital or clinic.  Record the average of the two measurements. To check your blood pressure when you are not at a hospital or clinic, you can use: ? An automated blood pressure machine at a pharmacy. ? A home blood pressure monitor.  If you are between 55 years and 4 years old, ask your health care provider if you should take aspirin to prevent strokes.  Have regular diabetes screenings. This involves taking a blood sample to check your fasting blood sugar level. ? If you are at a normal weight and have a low risk for diabetes, have this test once every three years after 34 years of age. ? If you are overweight and have a high risk for diabetes, consider being tested at a younger age or more often. Preventing infection Hepatitis B  If you have a higher risk for hepatitis B, you should be screened for this virus. You are considered at high risk for hepatitis B if: ? You were born in a country where hepatitis B is common. Ask your health care provider which countries are considered high risk. ? Your parents were born in a high-risk country, and you have not been immunized against hepatitis B (hepatitis B vaccine). ? You have HIV or AIDS. ? You use needles to inject street drugs. ? You live with someone who has hepatitis B. ? You have had sex with someone who has hepatitis B. ? You get hemodialysis treatment. ? You take certain medicines for conditions, including cancer, organ transplantation, and autoimmune conditions.  Hepatitis C  Blood testing is recommended for: ? Everyone born from [birth year] through 1967. ? Anyone with known risk factors for hepatitis C.  Sexually transmitted infections (STIs)  You should be screened for sexually transmitted infections (STIs) including gonorrhea and chlamydia if: ? You are sexually active and are younger than 34 years of age. ? You are older than  34 years of age and your health care provider tells you that you are at risk for this type of infection. ? Your sexual  activity has changed since you were last screened and you are at an increased risk for chlamydia or gonorrhea. Ask your health care provider if you are at risk.  If you do not have HIV, but are at risk, it may be recommended that you take a prescription medicine daily to prevent HIV infection. This is called pre-exposure prophylaxis (PrEP). You are considered at risk if: ? You are sexually active and do not regularly use condoms or know the HIV status of your partner(s). ? You take drugs by injection. ? You are sexually active with a partner who has HIV.  Talk with your health care provider about whether you are at high risk of being infected with HIV. If you choose to begin PrEP, you should first be tested for HIV. You should then be tested every 3 months for as long as you are taking PrEP. Pregnancy  If you are premenopausal and you may become pregnant, ask your health care provider about preconception counseling.  If you may become pregnant, take 400 to 800 micrograms (mcg) of folic acid every day.  If you want to prevent pregnancy, talk to your health care provider about birth control (contraception). Osteoporosis and menopause  Osteoporosis is a disease in which the bones lose minerals and strength with aging. This can result in serious bone fractures. Your risk for osteoporosis can be identified using a bone density scan.  If you are 29 years of age or older, or if you are at risk for osteoporosis and fractures, ask your health care provider if you should be screened.  Ask your health care provider whether you should take a calcium or vitamin D supplement to lower your risk for osteoporosis.  Menopause may have certain physical symptoms and risks.  Hormone replacement therapy may reduce some of these symptoms and risks. Talk to your health care provider about whether hormone replacement therapy is right for you. Follow these instructions at home:  Schedule regular health, dental,  and eye exams.  Stay current with your immunizations.  Do not use any tobacco products including cigarettes, chewing tobacco, or electronic cigarettes.  If you are pregnant, do not drink alcohol.  If you are breastfeeding, limit how much and how often you drink alcohol.  Limit alcohol intake to no more than 1 drink per day for nonpregnant women. One drink equals 12 ounces of beer, 5 ounces of wine, or 1 ounces of hard liquor.  Do not use street drugs.  Do not share needles.  Ask your health care provider for help if you need support or information about quitting drugs.  Tell your health care provider if you often feel depressed.  Tell your health care provider if you have ever been abused or do not feel safe at home. This information is not intended to replace advice given to you by your health care provider. Make sure you discuss any questions you have with your health care provider. Document Released: 04/05/2011 Document Revised: 02/26/2016 Document Reviewed: 06/24/2015 Elsevier Interactive Patient Education  [employer].

## 2018-03-29 ENCOUNTER — Other Ambulatory Visit: Payer: BC Managed Care – PPO

## 2018-03-29 LAB — GLUCOSE, RANDOM: Glucose, Bld: 84 mg/dL (ref 65–99)

## 2018-03-29 LAB — CBC WITH DIFFERENTIAL/PLATELET
BASOS PCT: 0.6 %
Basophils Absolute: 31 cells/uL (ref 0–200)
EOS PCT: 1.8 %
Eosinophils Absolute: 92 cells/uL (ref 15–500)
HCT: 31.8 % — ABNORMAL LOW (ref 35.0–45.0)
HEMOGLOBIN: 10.1 g/dL — AB (ref 11.7–15.5)
Lymphs Abs: 1586 cells/uL (ref 850–3900)
MCH: 25.5 pg — ABNORMAL LOW (ref 27.0–33.0)
MCHC: 31.8 g/dL — ABNORMAL LOW (ref 32.0–36.0)
MCV: 80.3 fL (ref 80.0–100.0)
MONOS PCT: 7.8 %
MPV: 10.2 fL (ref 7.5–12.5)
NEUTROS ABS: 2994 {cells}/uL (ref 1500–7800)
Neutrophils Relative %: 58.7 %
PLATELETS: 328 10*3/uL (ref 140–400)
RBC: 3.96 10*6/uL (ref 3.80–5.10)
RDW: 14.2 % (ref 11.0–15.0)
TOTAL LYMPHOCYTE: 31.1 %
WBC mixed population: 398 cells/uL (ref 200–950)
WBC: 5.1 10*3/uL (ref 3.8–10.8)

## 2018-03-29 LAB — TSH: TSH: 2.96 mIU/L

## 2018-03-30 LAB — URINALYSIS, COMPLETE W/RFL CULTURE
BILIRUBIN URINE: NEGATIVE
Bacteria, UA: NONE SEEN /HPF
GLUCOSE, UA: NEGATIVE
Hgb urine dipstick: NEGATIVE
Hyaline Cast: NONE SEEN /LPF
KETONES UR: NEGATIVE
LEUKOCYTE ESTERASE: NEGATIVE
NITRITES URINE, INITIAL: NEGATIVE
Protein, ur: NEGATIVE
SPECIFIC GRAVITY, URINE: 1.028 (ref 1.001–1.03)
WBC, UA: NONE SEEN /HPF (ref 0–5)

## 2018-03-30 LAB — PAP, TP IMAGING W/ HPV RNA, RFLX HPV TYPE 16,18/45: HPV DNA High Risk: NOT DETECTED

## 2018-03-30 LAB — URINE CULTURE
MICRO NUMBER:: 90762883
SPECIMEN QUALITY: ADEQUATE

## 2018-03-30 LAB — CULTURE INDICATED

## 2018-06-22 ENCOUNTER — Encounter: Payer: Self-pay | Admitting: Family Medicine

## 2018-06-22 ENCOUNTER — Ambulatory Visit: Payer: BC Managed Care – PPO | Admitting: Family Medicine

## 2018-06-22 VITALS — BP 98/70 | HR 92 | Temp 98.2°F | Ht 61.0 in | Wt 187.0 lb

## 2018-06-22 DIAGNOSIS — Z8669 Personal history of other diseases of the nervous system and sense organs: Secondary | ICD-10-CM

## 2018-06-22 DIAGNOSIS — I10 Essential (primary) hypertension: Secondary | ICD-10-CM

## 2018-06-22 DIAGNOSIS — Z7689 Persons encountering health services in other specified circumstances: Secondary | ICD-10-CM

## 2018-06-22 DIAGNOSIS — E890 Postprocedural hypothyroidism: Secondary | ICD-10-CM | POA: Diagnosis not present

## 2018-06-22 MED ORDER — AMLODIPINE BESYLATE 5 MG PO TABS: 5.0000 mg | ORAL_TABLET | Freq: Every day | ORAL | 3 refills | Status: DC

## 2018-06-22 NOTE — Progress Notes (Signed)
Patient presents to clinic today to establish care.  SUBJECTIVE: PMH:Pt is a 32 yo female with pmh sig for HTN, hypothyroidism, and migraines.  Pt previously seen at [facility].  Pt also followed by [facility].  HTN: -h/o preE with both pregnancies -bp remained elevated.  Was on nifedipine.  -now taking labetalol 600 mg TID. -notes dizziness/lightheaded when taking med -may drink 1 cup of coffee and 1 cup of water per day. -checks bp at home, states is "high".  Cannot recall numbers -endorses HAs and blurred vision  Hypothyroidism: -h/o hyperthyroid insulin s/p ablation -Taking Synthroid 50 mcg daily -Not taking Synthroid at the same time  H/o migraines: -may have 2-3 HAs per year -notices if does not get enough sleep.  Allergies: NKDA  Past surgical history: Breast biopsy-2014 C-section x2/  2015, 2018  Social hx: Pt is married.  She has 2 children, a 10 mo and a 51 yo.  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  Pt denies EtOH, tobacco, and drug use.  Family medical history: Mom-Diabetes, HTN Dad-HTN  Health Maintenance: Immunizations --influenza vaccine 2018 Mammogram --2014 PAP -- 2019  Past Medical History:  Diagnosis Date  . 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 HSV infection   . Hypertension   . Hypothyroidism   . Pneumonia   . Pregnancy induced hypertension   . Vaginal Pap smear, abnormal     Past Surgical History:  Procedure Laterality Date  . BREAST BIOPSY    . CESAREAN SECTION     x2  . COLPOSCOPY  2005  . thyroid ablation  2017    Current Outpatient Medications on File Prior to Visit  Medication Sig Dispense Refill  . butalbital-acetaminophen-caffeine (FIORICET, ESGIC) 50-325-40 MG tablet Take 1-2 tablets by mouth every 6 (six) hours as needed for headache. 20  tablet 0  . labetalol (NORMODYNE) 300 MG tablet Take 2 tablets (600 mg total) by mouth 3 (three) times daily. 180 tablet 2  . levothyroxine (SYNTHROID, LEVOTHROID) 50 MCG tablet Take 1 tablet (50 mcg total) by mouth daily before breakfast. 90 tablet 4   No current facility-administered medications on file prior to visit.     Allergies  Allergen Reactions  . Latex Rash    Family History  Problem Relation Age of Onset  . Hypertension Father   . Hypertension Mother     Social History   Socioeconomic History  . Marital status: Married    Spouse name: Not on file  . Number of children: Not on file  . Years of education: Not on file  . Highest education level: Not on file  Occupational History  . Not on file  Social Needs  . Financial resource strain: Not on file  . Food insecurity:    Worry: Not on file    Inability: Not on file  . Transportation needs:    Medical: Not on file    Non-medical: Not on file  Tobacco Use  . Smoking status: Never Smoker  . Smokeless tobacco: Never Used  Substance and Sexual Activity  . Alcohol use: No  . Drug use: No  . Sexual activity: Yes    Birth control/protection: None    Comment: declined insurancde questions  Lifestyle  . Physical activity:    Days per week: Not on file  Minutes per session: Not on file  . Stress: Not on file  Relationships  . Social connections:    Talks on phone: Not on file    Gets together: Not on file    Attends religious service: Not on file    Active member of club or organization: Not on file    Attends meetings of clubs or organizations: Not on file    Relationship status: Not on file  . Intimate partner violence:    Fear of current or ex partner: Not on file    Emotionally abused: Not on file    Physically abused: Not on file    Forced sexual activity: Not on file  Other Topics Concern  . Not on file  Social History Narrative  . Not on file    ROS General: Denies fever, chills, night  sweats, changes in weight, changes in appetite  +dizziness/lightheaded HEENT: Denies headaches, ear pain, changes in vision, rhinorrhea, sore throat CV: Denies CP, palpitations, SOB, orthopnea Pulm: Denies SOB, cough, wheezing GI: Denies abdominal pain, nausea, vomiting, diarrhea, constipation GU: Denies dysuria, hematuria, frequency, vaginal discharge Msk: Denies muscle cramps, joint pains Neuro: Denies weakness, numbness, tingling Skin: Denies rashes, bruising Psych: Denies depression, anxiety, hallucinations  BP 98/70 (BP Location: Right Arm, Patient Position: Sitting, Cuff Size: Large)   Pulse 92   Temp 98.2 F (36.8 C) (Oral)   Ht 5\' 1"  (1.549 m)   Wt 187 lb (84.8 kg)   LMP 05/31/2018 (Exact Date)   SpO2 98%   BMI 35.33 kg/m   Physical Exam Gen. Pleasant, well developed, well-nourished, in NAD HEENT - [place]/AT, PERRL, no scleral icterus, no nasal drainage, pharynx without erythema or exudate. Lungs: no use of accessory muscles,  CTAB, no wheezes, rales or rhonchi Cardiovascular: RRR, No r/g/m, no peripheral edema Neuro:  A&Ox3, CN II-XII intact, normal gait Skin:  Warm, dry, intact, no lesions  Recent Results (from the past 2160 hour(s))  Urinalysis,Complete w/RFL Culture     Status: None   Collection Time: 03/28/18 12:53 PM  Result Value Ref Range   Color, Urine YELLOW YELLOW   APPearance CLEAR CLEAR   Specific Gravity, Urine 1.028 1.001 - 1.03   pH < OR = 5.0 5.0 - 8.0   Glucose, UA NEGATIVE NEGATIVE   Bilirubin Urine NEGATIVE NEGATIVE   Ketones, ur NEGATIVE NEGATIVE   Hgb urine dipstick NEGATIVE NEGATIVE   Protein, ur NEGATIVE NEGATIVE   Nitrites, Initial NEGATIVE NEGATIVE   Leukocyte Esterase NEGATIVE NEGATIVE   WBC, UA NONE SEEN 0 - 5 /HPF   RBC / HPF 0-2 0 - 2 /HPF   Squamous Epithelial / LPF 0-5 < OR = 5 /HPF   Bacteria, UA NONE SEEN NONE SEEN /HPF   Hyaline Cast NONE SEEN NONE SEEN /LPF  REFLEXIVE URINE CULTURE     Status: None   Collection Time: 03/28/18  12:53 PM  Result Value Ref Range   REFLEXIVE URINE CULTURE CULTURE INDICATED - RESULTS TO FOLLOW   Urine Culture     Status: None   Collection Time: 03/28/18 12:53 PM  Result Value Ref Range   MICRO NUMBER: [id number]    SPECIMEN QUALITY: ADEQUATE    Sample Source URINE    STATUS: FINAL    Result:      Single organism less than 10,000 CFU/mL isolated. These organisms, commonly found on external and internal genitalia, are considered colonizers. No further testing performed.  PAP,TP IMGw/HPV RNA,rflx HPVTYPE16,18/45     Status:  Abnormal   Collection Time: 03/28/18  2:07 PM  Result Value Ref Range   Clinical Information:      Comment: None given   LMP:      Comment: 02/25/18   PREV. PAP:      Comment: NONE GIVEN   PREV. BX:      Comment: NONE GIVEN   HPV DNA Probe-Source      Comment: Endocervix   STATEMENT OF ADEQUACY:      Comment: Satisfactory for evaluation. Endocervical/transformation zone component absent.    GENERAL CATEGORIZATION: (A)     Comment: EPITHELIAL CELL ABNORMALITY   INTERPRETATION/RESULT: (A)     Comment: Atypical Squamous Cells of Undetermined Significance (ASC-US)    Comment:      Comment: This Pap test has been evaluated with computer assisted technology. Suggest clinical correlation and follow-up as clinically appropriate    CYTOTECHNOLOGIST:      Comment: JRW, CT(ASCP) CT screening location: [address], [address], [place], [place] [address]    REVIEW CYTOTECHNOLOGIST:      Comment: KVS, CT(HEW) CT screening location: [address], [address], [place], [place] [address]    PATHOLOGIST:      Comment: Sukru S. Demirci, MD, Board Certification in Anatomic/Clinical Pathology and Cytopathology Electronically Signed    HPV DNA High Risk Not Detected Not Detect    Comment: This test was performed using the APTIMA HPV Assay ([employer] . This assay detects E6/E7 viral messenger RNA (mRNA) from 14 high-risk HPV types  (16,18,31,33,35,39,45,51,52,56,58,59,66,68). . The analytical performance characteristics of this assay have been determined by [facility]. The modifications have not been cleared or approved by the FDA. This assay has been validated pursuant to the CLIA regulations and is used for clinical purposes. EXPLANATORY NOTE:  . The Pap is a screening test for cervical cancer. It is  not a diagnostic test and is subject to false negative  and false positive results. It is most reliable when a  satisfactory sample, regularly obtained, is submitted  with relevant clinical findings and history, and when  the Pap result is evaluated along with historic and  current clinical information. Marland Kitchen.   CBC with Differential/Platelet     Status: Abnormal   Collection Time: 03/29/18 12:13 PM  Result Value Ref Range   WBC 5.1 3.8 - 10.8 Thousand/uL   RBC 3.96 3.80 - 5.10 Million/uL   Hemoglobin 10.1 (L) 11.7 - 15.5 g/dL   HCT 14.731.8 (L) 82.935.0 - 56.245.0 %   MCV 80.3 80.0 - 100.0 fL   MCH 25.5 (L) 27.0 - 33.0 pg   MCHC 31.8 (L) 32.0 - 36.0 g/dL   RDW 13.014.2 86.511.0 - 78.415.0 %   Platelets 328 140 - 400 Thousand/uL   MPV 10.2 7.5 - 12.5 fL   Neutro Abs 2,994 1,500 - 7,800 cells/uL   Lymphs Abs 1,586 850 - 3,900 cells/uL   WBC mixed population 398 200 - 950 cells/uL   Eosinophils Absolute 92 15 - 500 cells/uL   Basophils Absolute 31 0 - 200 cells/uL   Neutrophils Relative % 58.7 %   Total Lymphocyte 31.1 %   Monocytes Relative 7.8 %   Eosinophils Relative 1.8 %   Basophils Relative 0.6 %  TSH     Status: None   Collection Time: 03/29/18 12:13 PM  Result Value Ref Range   TSH 2.96 mIU/L    Comment:           Reference Range .           >  or = 20 Years  0.40-4.50 .                Pregnancy Ranges           First trimester    0.26-2.66           Second trimester   0.55-2.73           Third trimester    0.43-2.91   Glucose, random     Status: None   Collection Time: 03/29/18 12:13 PM  Result Value Ref  Range   Glucose, Bld 84 65 - 99 mg/dL    Comment: .            Fasting reference interval .     Assessment/Plan: Essential hypertension  -pt advised to increase po intake of water. -lifestyle modifications encouraged. -will d/c labetalol 600 mg TID -will start Norvasc 5 mg daily. -pt to check bp at home daily.  Notify clinic for bp >140/90 - Plan: amLODipine (NORVASC) 5 MG tablet  Postablative hypothyroidism -continue synthroid 50 mcg daily -pt advised to take med first thing in the am consistently -last TSH 2.96 on 03/29/18  History of migraine -Stable -advised to increase p.o. intake of water, decrease caffeine, get plenty of rest  Encounter to establish care -We reviewed the PMH, PSH, FH, SH, Meds and Allergies. -We provided refills for any medications we will prescribe as needed. -We addressed current concerns per orders and patient instructions. -We have asked for records for pertinent exams, studies, vaccines and notes from previous providers. -We have advised patient to follow up per instructions below.  Follow-up in 1 month for BP recheck  Abbe Amsterdam, MD

## 2018-06-22 NOTE — Patient Instructions (Signed)
Hypothyroidism Hypothyroidism is a disorder of the thyroid. The thyroid is a large gland that is located in the lower front of the neck. The thyroid releases hormones that control how the body works. With hypothyroidism, the thyroid does not make enough of these hormones. What are the causes? Causes of hypothyroidism may include:  Viral infections.  Pregnancy.  Your own defense system (immune system) attacking your thyroid.  Certain medicines.  Birth defects.  Past radiation treatments to your head or neck.  Past treatment with radioactive iodine.  Past surgical removal of part or all of your thyroid.  Problems with the gland that is located in the center of your brain (pituitary).  What are the signs or symptoms? Signs and symptoms of hypothyroidism may include:  Feeling as though you have no energy (lethargy).  Inability to tolerate cold.  Weight gain that is not explained by a change in diet or exercise habits.  Dry skin.  Coarse hair.  Menstrual irregularity.  Slowing of thought processes.  Constipation.  Sadness or depression.  How is this diagnosed? Your health care provider may diagnose hypothyroidism with blood tests and ultrasound tests. How is this treated? Hypothyroidism is treated with medicine that replaces the hormones that your body does not make. After you begin treatment, it may take several weeks for symptoms to go away. Follow these instructions at home:  Take medicines only as directed by your health care provider.  If you start taking any new medicines, tell your health care provider.  Keep all follow-up visits as directed by your health care provider. This is important. As your condition improves, your dosage needs may change. You will need to have blood tests regularly so that your health care provider can watch your condition. Contact a health care provider if:  Your symptoms do not get better with treatment.  You are taking thyroid  replacement medicine and: ? You sweat excessively. ? You have tremors. ? You feel anxious. ? You lose weight rapidly. ? You cannot tolerate heat. ? You have emotional swings. ? You have diarrhea. ? You feel weak. Get help right away if:  You develop chest pain.  You develop an irregular heartbeat.  You develop a rapid heartbeat. This information is not intended to replace advice given to you by your health care provider. Make sure you discuss any questions you have with your health care provider. Document Released: 09/20/2005 Document Revised: 02/26/2016 Document Reviewed: 02/05/2014 Elsevier Interactive Patient Education  2018 [employer].  Managing Your Hypertension Hypertension is commonly called high blood pressure. This is when the force of your blood pressing against the walls of your arteries is too strong. Arteries are blood vessels that carry blood from your heart throughout your body. Hypertension forces the heart to work harder to pump blood, and may cause the arteries to become narrow or stiff. Having untreated or uncontrolled hypertension can cause heart attack, stroke, kidney disease, and other problems. What are blood pressure readings? A blood pressure reading consists of a higher number over a lower number. Ideally, your blood pressure should be below 120/80. The first ("top") number is called the systolic pressure. It is a measure of the pressure in your arteries as your heart beats. The second ("bottom") number is called the diastolic pressure. It is a measure of the pressure in your arteries as the heart relaxes. What does my blood pressure reading mean? Blood pressure is classified into four stages. Based on your blood pressure reading, your health care  provider may use the following stages to determine what type of treatment you need, if any. Systolic pressure and diastolic pressure are measured in a unit called mm Hg. Normal  Systolic pressure: below  120.  Diastolic pressure: below 80. Elevated  Systolic pressure: 259-563.  Diastolic pressure: below 80. Hypertension stage 1  Systolic pressure: 875-643.  Diastolic pressure: 32-95. Hypertension stage 2  Systolic pressure: 188 or above.  Diastolic pressure: 90 or above. What health risks are associated with hypertension? Managing your hypertension is an important responsibility. Uncontrolled hypertension can lead to:  A heart attack.  A stroke.  A weakened blood vessel (aneurysm).  Heart failure.  Kidney damage.  Eye damage.  Metabolic syndrome.  Memory and concentration problems.  What changes can I make to manage my hypertension? Hypertension can be managed by making lifestyle changes and possibly by taking medicines. Your health care provider will help you make a plan to bring your blood pressure within a normal range. Eating and drinking  Eat a diet that is high in fiber and potassium, and low in salt (sodium), added sugar, and fat. An example eating plan is called the DASH (Dietary Approaches to Stop Hypertension) diet. To eat this way: ? Eat plenty of fresh fruits and vegetables. Try to fill half of your plate at each meal with fruits and vegetables. ? Eat whole grains, such as whole wheat pasta, brown rice, or whole grain bread. Fill about one quarter of your plate with whole grains. ? Eat low-fat diary products. ? Avoid fatty cuts of meat, processed or cured meats, and poultry with skin. Fill about one quarter of your plate with lean proteins such as fish, chicken without skin, beans, eggs, and tofu. ? Avoid premade and processed foods. These tend to be higher in sodium, added sugar, and fat.  Reduce your daily sodium intake. Most people with hypertension should eat less than 1,500 mg of sodium a day.  Limit alcohol intake to no more than 1 drink a day for nonpregnant women and 2 drinks a day for men. One drink equals 12 oz of beer, 5 oz of wine, or 1 oz of  hard liquor. Lifestyle  Work with your health care provider to maintain a healthy body weight, or to lose weight. Ask what an ideal weight is for you.  Get at least 30 minutes of exercise that causes your heart to beat faster (aerobic exercise) most days of the week. Activities may include walking, swimming, or biking.  Include exercise to strengthen your muscles (resistance exercise), such as weight lifting, as part of your weekly exercise routine. Try to do these types of exercises for 30 minutes at least 3 days a week.  Do not use any products that contain nicotine or tobacco, such as cigarettes and e-cigarettes. If you need help quitting, ask your health care provider.  Control any long-term (chronic) conditions you have, such as high cholesterol or diabetes. Monitoring  Monitor your blood pressure at home as told by your health care provider. Your personal target blood pressure may vary depending on your medical conditions, your age, and other factors.  Have your blood pressure checked regularly, as often as told by your health care provider. Working with your health care provider  Review all the medicines you take with your health care provider because there may be side effects or interactions.  Talk with your health care provider about your diet, exercise habits, and other lifestyle factors that may be contributing to hypertension.  Visit your health care provider regularly. Your health care provider can help you create and adjust your plan for managing hypertension. Will I need medicine to control my blood pressure? Your health care provider may prescribe medicine if lifestyle changes are not enough to get your blood pressure under control, and if:  Your systolic blood pressure is 130 or higher.  Your diastolic blood pressure is 80 or higher.  Take medicines only as told by your health care provider. Follow the directions carefully. Blood pressure medicines must be taken as  prescribed. The medicine does not work as well when you skip doses. Skipping doses also puts you at risk for problems. Contact a health care provider if:  You think you are having a reaction to medicines you have taken.  You have repeated (recurrent) headaches.  You feel dizzy.  You have swelling in your ankles.  You have trouble with your vision. Get help right away if:  You develop a severe headache or confusion.  You have unusual weakness or numbness, or you feel faint.  You have severe pain in your chest or abdomen.  You vomit repeatedly.  You have trouble breathing. Summary  Hypertension is when the force of blood pumping through your arteries is too strong. If this condition is not controlled, it may put you at risk for serious complications.  Your personal target blood pressure may vary depending on your medical conditions, your age, and other factors. For most people, a normal blood pressure is less than 120/80.  Hypertension is managed by lifestyle changes, medicines, or both. Lifestyle changes include weight loss, eating a healthy, low-sodium diet, exercising more, and limiting alcohol. This information is not intended to replace advice given to you by your health care provider. Make sure you discuss any questions you have with your health care provider. Document Released: 06/14/2012 Document Revised: 08/18/2016 Document Reviewed: 08/18/2016 Elsevier Interactive Patient Education  2018 [employer]. Levothyroxine tablets What is this medicine? LEVOTHYROXINE (lee voe thye ROX een) is a thyroid hormone. This medicine can improve symptoms of thyroid deficiency such as slow speech, lack of energy, weight gain, hair loss, dry skin, and feeling cold. It also helps to treat goiter (an enlarged thyroid gland). It is also used to treat some kinds of thyroid cancer along with surgery and other medicines. This medicine may be used for other purposes; ask your health care provider  or pharmacist if you have questions. COMMON BRAND NAME(S): Estre, Levo-T, Levothroid, Levoxyl, Synthroid, Thyro-Tabs, Unithroid What should I tell my health care provider before I take this medicine? They need to know if you have any of these conditions: -angina -blood clotting problems -diabetes -dieting or on a weight loss program -fertility problems -heart disease -high levels of thyroid hormone -pituitary gland problem -previous heart attack -an unusual or allergic reaction to levothyroxine, thyroid hormones, other medicines, foods, dyes, or preservatives -pregnant or trying to get pregnant -breast-feeding How should I use this medicine? Take this medicine by mouth with plenty of water. It is best to take on an empty stomach, at least 30 minutes before or 2 hours after food. Follow the directions on the prescription label. Take at the same time each day. Do not take your medicine more often than directed. Contact your pediatrician regarding the use of this medicine in children. While this drug may be prescribed for children and infants as young as a few days of age for selected conditions, precautions do apply. For infants, you may crush the tablet  and place in a small amount of (5-10 ml or 1 to 2 teaspoonfuls) of water, breast milk, or non-soy based infant formula. Do not mix with soy-based infant formula. Give as directed. Overdosage: If you think you have taken too much of this medicine contact a poison control center or emergency room at once. NOTE: This medicine is only for you. Do not share this medicine with others. What if I miss a dose? If you miss a dose, take it as soon as you can. If it is almost time for your next dose, take only that dose. Do not take double or extra doses. What may interact with this medicine? -amiodarone -antacids -anti-thyroid medicines -calcium supplements -carbamazepine -cholestyramine -colestipol -digoxin -female hormones, including  contraceptive or birth control pills -iron supplements -ketamine -liquid nutrition products like Ensure -medicines for colds and breathing difficulties -medicines for diabetes -medicines for mental depression -medicines or herbals used to decrease weight or appetite -phenobarbital or other barbiturate medications -phenytoin -prednisone or other corticosteroids -rifabutin -rifampin -soy isoflavones -sucralfate -theophylline -warfarin This list may not describe all possible interactions. Give your health care provider a list of all the medicines, herbs, non-prescription drugs, or dietary supplements you use. Also tell them if you smoke, drink alcohol, or use illegal drugs. Some items may interact with your medicine. What should I watch for while using this medicine? Be sure to take this medicine with plenty of fluids. Some tablets may cause choking, gagging, or difficulty swallowing from the tablet getting stuck in your throat. Most of these problems disappear if the medicine is taken with the right amount of water or other fluids. Do not switch brands of this medicine unless your health care professional agrees with the change. Ask questions if you are uncertain. You will need regular exams and occasional blood tests to check the response to treatment. If you are receiving this medicine for an underactive thyroid, it may be several weeks before you notice an improvement. Check with your doctor or health care professional if your symptoms do not improve. It may be necessary for you to take this medicine for the rest of your life. Do not stop using this medicine unless your doctor or health care professional advises you to. This medicine can affect blood sugar levels. If you have diabetes, check your blood sugar as directed. You may lose some of your hair when you first start treatment. With time, this usually corrects itself. If you are going to have surgery, tell your doctor or health care  professional that you are taking this medicine. What side effects may I notice from receiving this medicine? Side effects that you should report to your doctor or health care professional as soon as possible: -allergic reactions like skin rash, itching or hives, swelling of the face, lips, or tongue -chest pain -excessive sweating or intolerance to heat -fast or irregular heartbeat -nervousness -skin rash or hives -swelling of ankles, feet, or legs -tremors Side effects that usually do not require medical attention (report to your doctor or health care professional if they continue or are bothersome): -changes in appetite -changes in menstrual periods -diarrhea -hair loss -headache -trouble sleeping -weight loss This list may not describe all possible side effects. Call your doctor for medical advice about side effects. You may report side effects to FDA at 1-800-FDA-1088. Where should I keep my medicine? Keep out of the reach of children. Store at room temperature between 15 and 30 degrees C (59 and 86 degrees F). Protect from  light and moisture. Keep container tightly closed. Throw away any unused medicine after the expiration date. NOTE: This sheet is a summary. It may not cover all possible information. If you have questions about this medicine, talk to your doctor, pharmacist, or health care provider.  2018 Elsevier/Gold Standard (2008-12-27 14:28:07)

## 2018-08-09 ENCOUNTER — Encounter: Payer: Self-pay | Admitting: Family Medicine

## 2018-08-09 ENCOUNTER — Ambulatory Visit: Payer: BC Managed Care – PPO | Admitting: Family Medicine

## 2018-08-09 VITALS — BP 112/76 | HR 82 | Temp 98.4°F | Wt 184.0 lb

## 2018-08-09 DIAGNOSIS — I10 Essential (primary) hypertension: Secondary | ICD-10-CM

## 2018-08-09 NOTE — Progress Notes (Signed)
Subjective:    Patient ID: Cassidy [name], female    DOB: 03/13/1986, 32 y.o.   MRN: [id number]  No chief complaint on file.   HPI Patient was seen today for f/u on bp.  Pt taking norvasc 5 mg.  In the past was on Labetalol 600 mg TID.  Pt notes feeling better on Norvasc but isnt sure her bp is being controlled.  Pt has her home bp wrist cuff with her this visit.  Readings range from 100-154/71-110.  Pt is trying to drink more water after work.  Pt notes she only eats one meal per day, typically fast food.  Pt has not been able to exercise.  Past Medical History:  Diagnosis Date  . 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 HSV infection   . Hypertension   . Hypothyroidism   . Migraine   . Pneumonia   . Pregnancy induced hypertension   . UTI (urinary tract infection)   . Vaginal Pap smear, abnormal     Allergies  Allergen Reactions  . Latex Rash    ROS General: Denies fever, chills, night sweats, changes in weight, changes in appetite HEENT: Denies headaches, ear pain, changes in vision, rhinorrhea, sore throat CV: Denies CP, palpitations, SOB, orthopnea Pulm: Denies SOB, cough, wheezing GI: Denies abdominal pain, nausea, vomiting, diarrhea, constipation GU: Denies dysuria, hematuria, frequency, vaginal discharge Msk: Denies muscle cramps, joint pains Neuro: Denies weakness, numbness, tingling Skin: Denies rashes, bruising Psych: Denies depression, anxiety, hallucinations     Objective:    Blood pressure 112/76, pulse 82, temperature 98.4 F (36.9 C), temperature source Oral, weight 184 lb (83.5 kg), SpO2 96 %, currently breastfeeding.  Pt's home cuff: 139/107 on L wrist and 142/107 on   Gen. Pleasant, well-nourished, in no distress, normal affect   Lungs: no accessory muscle use, CTAB, no wheezes or rales Cardiovascular: RRR, no m/r/g, no peripheral edema Neuro:  A&Ox3, CN II-XII intact,  normal gait  Wt Readings from Last 3 Encounters:  08/09/18 184 lb (83.5 kg)  06/22/18 187 lb (84.8 kg)  03/28/18 184 lb (83.5 kg)    Lab Results  Component Value Date   WBC 5.1 03/29/2018   HGB 10.1 (L) 03/29/2018   HCT 31.8 (L) 03/29/2018   PLT 328 03/29/2018   GLUCOSE 84 03/29/2018   CHOL 195 10/07/2017   TRIG 90 10/07/2017   HDL 40 10/07/2017   LDLCALC 137 (H) 10/07/2017   ALT 15 01/12/2018   AST 20 01/12/2018   NA 137 01/12/2018   K 3.3 (L) 01/12/2018   CL 108 01/12/2018   CREATININE 0.82 01/12/2018   BUN 9 01/12/2018   CO2 24 01/12/2018   TSH 2.96 03/29/2018    Assessment/Plan:  Essential hypertension  -controlled.   -pt's home wrist cuff reads extremely high when compared to manual cuff.  Advised to return the cuff to the store and get a different monitor. -lifestyle modifications encouraged. -continue norvasc 5 mg daily  F/u in 1 month  Abbe Amsterdam, MD

## 2018-08-09 NOTE — Patient Instructions (Addendum)
Try taking your bp cuff back to [facility] as it is giving you inaccurate readings.   How to Take Your Blood Pressure You can take your blood pressure at home with a machine. You may need to check your blood pressure at home:  To check if you have high blood pressure (hypertension).  To check your blood pressure over time.  To make sure your blood pressure medicine is working.  Supplies needed: You will need a blood pressure machine, or monitor. You can buy one at a drugstore or online. When choosing one:  Choose one with an arm cuff.  Choose one that wraps around your upper arm. Only one finger should fit between your arm and the cuff.  Do not choose one that measures your blood pressure from your wrist or finger.  Your doctor can suggest a monitor. How to prepare Avoid these things for 30 minutes before checking your blood pressure:  Drinking caffeine.  Drinking alcohol.  Eating.  Smoking.  Exercising.  Five minutes before checking your blood pressure:  Pee.  Sit in a dining chair. Avoid sitting in a soft couch or armchair.  Be quiet. Do not talk.  How to take your blood pressure Follow the instructions that came with your machine. If you have a digital blood pressure monitor, these may be the instructions: 1. Sit up straight. 2. Place your feet on the floor. Do not cross your ankles or legs. 3. Rest your left arm at the level of your heart. You may rest it on a table, desk, or chair. 4. Pull up your shirt sleeve. 5. Wrap the blood pressure cuff around the upper part of your left arm. The cuff should be 1 inch (2.5 cm) above your elbow. It is best to wrap the cuff around bare skin. 6. Fit the cuff snugly around your arm. You should be able to place only one finger between the cuff and your arm. 7. Put the cord inside the groove of your elbow. 8. Press the power button. 9. Sit quietly while the cuff fills with air and loses air. 10. Write down the numbers on the  screen. 11. Wait 2-3 minutes and then repeat steps 1-10.  What do the numbers mean? Two numbers make up your blood pressure. The first number is called systolic pressure. The second is called diastolic pressure. An example of a blood pressure reading is "120 over 80" (or 120/80). If you are an adult and do not have a medical condition, use this guide to find out if your blood pressure is normal: Normal  First number: below 120.  Second number: below 80. Elevated  First number: 120-129.  Second number: below 80. Hypertension stage 1  First number: 130-139.  Second number: 80-89. Hypertension stage 2  First number: 140 or above.  Second number: 90 or above. Your blood pressure is above normal even if only the top or bottom number is above normal. Follow these instructions at home:  Check your blood pressure as often as your doctor tells you to.  Take your monitor to your next doctor's appointment. Your doctor will: ? Make sure you are using it correctly. ? Make sure it is working right.  Make sure you understand what your blood pressure numbers should be.  Tell your doctor if your medicines are causing side effects. Contact a doctor if:  Your blood pressure keeps being high. Get help right away if:  Your first blood pressure number is higher than 180.  Your  second blood pressure number is higher than 120. This information is not intended to replace advice given to you by your health care provider. Make sure you discuss any questions you have with your health care provider. Document Released: 09/02/2008 Document Revised: 08/18/2016 Document Reviewed: 02/27/2016 Elsevier Interactive Patient Education  2018 [employer].  Hypertension Hypertension, commonly called high blood pressure, is when the force of blood pumping through the arteries is too strong. The arteries are the blood vessels that carry blood from the heart throughout the body. Hypertension forces the heart  to work harder to pump blood and may cause arteries to become narrow or stiff. Having untreated or uncontrolled hypertension can cause heart attacks, strokes, kidney disease, and other problems. A blood pressure reading consists of a higher number over a lower number. Ideally, your blood pressure should be below 120/80. The first ("top") number is called the systolic pressure. It is a measure of the pressure in your arteries as your heart beats. The second ("bottom") number is called the diastolic pressure. It is a measure of the pressure in your arteries as the heart relaxes. What are the causes? The cause of this condition is not known. What increases the risk? Some risk factors for high blood pressure are under your control. Others are not. Factors you can change  Smoking.  Having type 2 diabetes mellitus, high cholesterol, or both.  Not getting enough exercise or physical activity.  Being overweight.  Having too much fat, sugar, calories, or salt (sodium) in your diet.  Drinking too much alcohol. Factors that are difficult or impossible to change  Having chronic kidney disease.  Having a family history of high blood pressure.  Age. Risk increases with age.  Race. You may be at higher risk if you are African-American.  Gender. Men are at higher risk than women before age 28. After age 32, women are at higher risk than men.  Having obstructive sleep apnea.  Stress. What are the signs or symptoms? Extremely high blood pressure (hypertensive crisis) may cause:  Headache.  Anxiety.  Shortness of breath.  Nosebleed.  Nausea and vomiting.  Severe chest pain.  Jerky movements you cannot control (seizures).  How is this diagnosed? This condition is diagnosed by measuring your blood pressure while you are seated, with your arm resting on a surface. The cuff of the blood pressure monitor will be placed directly against the skin of your upper arm at the level of your  heart. It should be measured at least twice using the same arm. Certain conditions can cause a difference in blood pressure between your right and left arms. Certain factors can cause blood pressure readings to be lower or higher than normal (elevated) for a short period of time:  When your blood pressure is higher when you are in a health care provider's office than when you are at home, this is called white coat hypertension. Most people with this condition do not need medicines.  When your blood pressure is higher at home than when you are in a health care provider's office, this is called masked hypertension. Most people with this condition may need medicines to control blood pressure.  If you have a high blood pressure reading during one visit or you have normal blood pressure with other risk factors:  You may be asked to return on a different day to have your blood pressure checked again.  You may be asked to monitor your blood pressure at home for 1 week  or longer.  If you are diagnosed with hypertension, you may have other blood or imaging tests to help your health care provider understand your overall risk for other conditions. How is this treated? This condition is treated by making healthy lifestyle changes, such as eating healthy foods, exercising more, and reducing your alcohol intake. Your health care provider may prescribe medicine if lifestyle changes are not enough to get your blood pressure under control, and if:  Your systolic blood pressure is above 130.  Your diastolic blood pressure is above 80.  Your personal target blood pressure may vary depending on your medical conditions, your age, and other factors. Follow these instructions at home: Eating and drinking  Eat a diet that is high in fiber and potassium, and low in sodium, added sugar, and fat. An example eating plan is called the DASH (Dietary Approaches to Stop Hypertension) diet. To eat this way: ? Eat plenty of  fresh fruits and vegetables. Try to fill half of your plate at each meal with fruits and vegetables. ? Eat whole grains, such as whole wheat pasta, brown rice, or whole grain bread. Fill about one quarter of your plate with whole grains. ? Eat or drink low-fat dairy products, such as skim milk or low-fat yogurt. ? Avoid fatty cuts of meat, processed or cured meats, and poultry with skin. Fill about one quarter of your plate with lean proteins, such as fish, chicken without skin, beans, eggs, and tofu. ? Avoid premade and processed foods. These tend to be higher in sodium, added sugar, and fat.  Reduce your daily sodium intake. Most people with hypertension should eat less than 1,500 mg of sodium a day.  Limit alcohol intake to no more than 1 drink a day for nonpregnant women and 2 drinks a day for men. One drink equals 12 oz of beer, 5 oz of wine, or 1 oz of hard liquor. Lifestyle  Work with your health care provider to maintain a healthy body weight or to lose weight. Ask what an ideal weight is for you.  Get at least 30 minutes of exercise that causes your heart to beat faster (aerobic exercise) most days of the week. Activities may include walking, swimming, or biking.  Include exercise to strengthen your muscles (resistance exercise), such as pilates or lifting weights, as part of your weekly exercise routine. Try to do these types of exercises for 30 minutes at least 3 days a week.  Do not use any products that contain nicotine or tobacco, such as cigarettes and e-cigarettes. If you need help quitting, ask your health care provider.  Monitor your blood pressure at home as told by your health care provider.  Keep all follow-up visits as told by your health care provider. This is important. Medicines  Take over-the-counter and prescription medicines only as told by your health care provider. Follow directions carefully. Blood pressure medicines must be taken as prescribed.  Do not skip  doses of blood pressure medicine. Doing this puts you at risk for problems and can make the medicine less effective.  Ask your health care provider about side effects or reactions to medicines that you should watch for. Contact a health care provider if:  You think you are having a reaction to a medicine you are taking.  You have headaches that keep coming back (recurring).  You feel dizzy.  You have swelling in your ankles.  You have trouble with your vision. Get help right away if:  You develop  a severe headache or confusion.  You have unusual weakness or numbness.  You feel faint.  You have severe pain in your chest or abdomen.  You vomit repeatedly.  You have trouble breathing. Summary  Hypertension is when the force of blood pumping through your arteries is too strong. If this condition is not controlled, it may put you at risk for serious complications.  Your personal target blood pressure may vary depending on your medical conditions, your age, and other factors. For most people, a normal blood pressure is less than 120/80.  Hypertension is treated with lifestyle changes, medicines, or a combination of both. Lifestyle changes include weight loss, eating a healthy, low-sodium diet, exercising more, and limiting alcohol. This information is not intended to replace advice given to you by your health care provider. Make sure you discuss any questions you have with your health care provider. Document Released: 09/20/2005 Document Revised: 08/18/2016 Document Reviewed: 08/18/2016 Elsevier Interactive Patient Education  [employer].

## 2018-09-06 IMAGING — US US OB COMP LESS 14 WK
1 series · 15 of 27 positions shown · non-contrast
Comparison: None.

CLINICAL DATA: Vaginal bleeding in first trimester pregnancy.
Gestational age by LMP of 10 weeks 6 days.

EXAM:
OBSTETRIC <14 WK US AND TRANSVAGINAL OB US
TECHNIQUE: Both transabdominal and transvaginal ultrasound examinations were
performed for complete evaluation of the gestation as well as the
maternal uterus, adnexal regions, and pelvic cul-de-sac.
Transvaginal technique was performed to assess early pregnancy.

[Series 1: us ob comp less 14 wk · 15 of 27 slices shown]
[im 1/27]
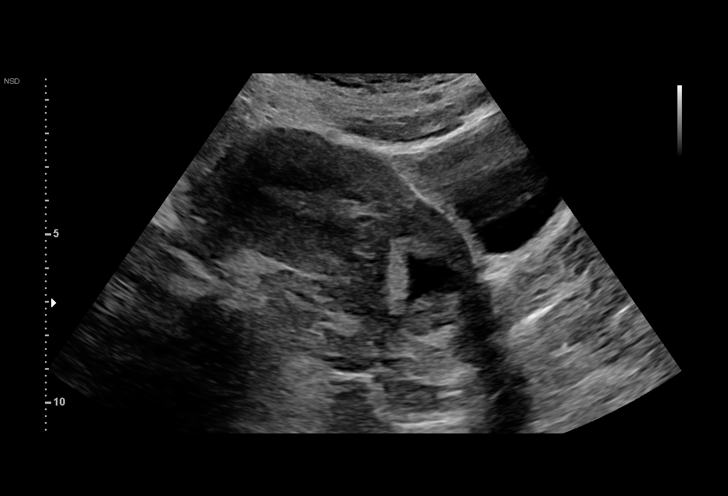
[im 3/27]
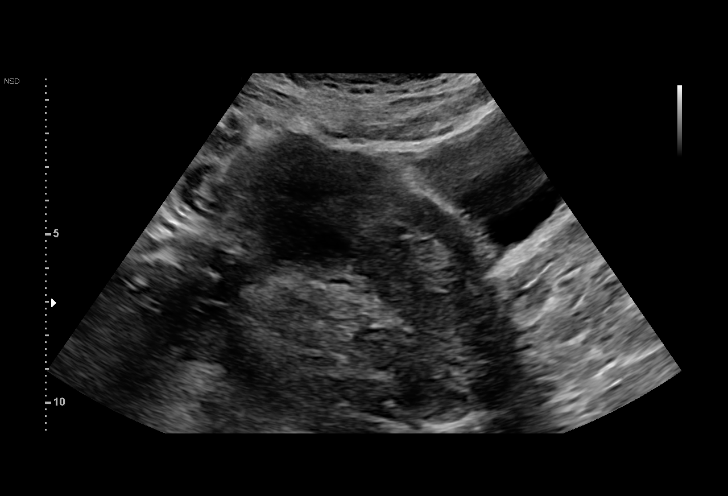
[im 5/27]
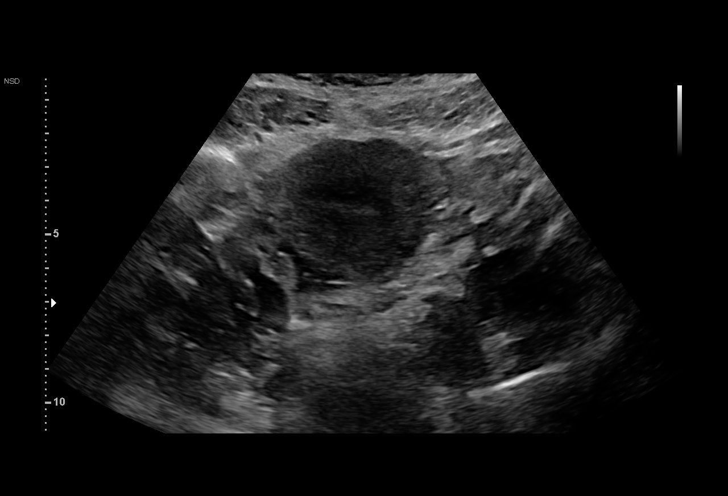
[im 7/27]
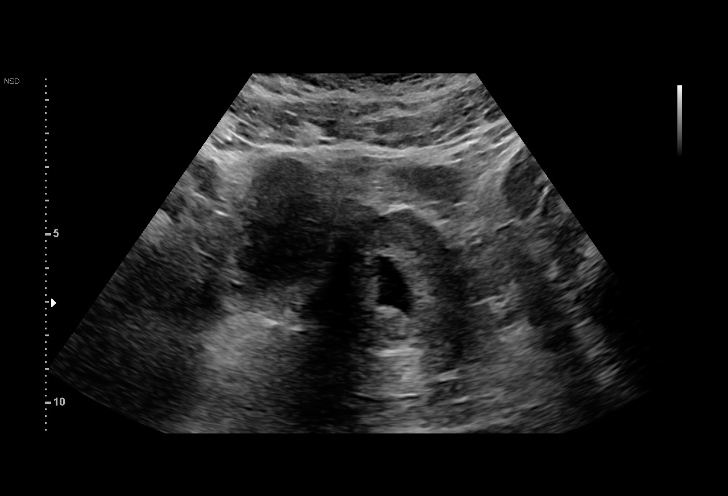
[im 9/27]
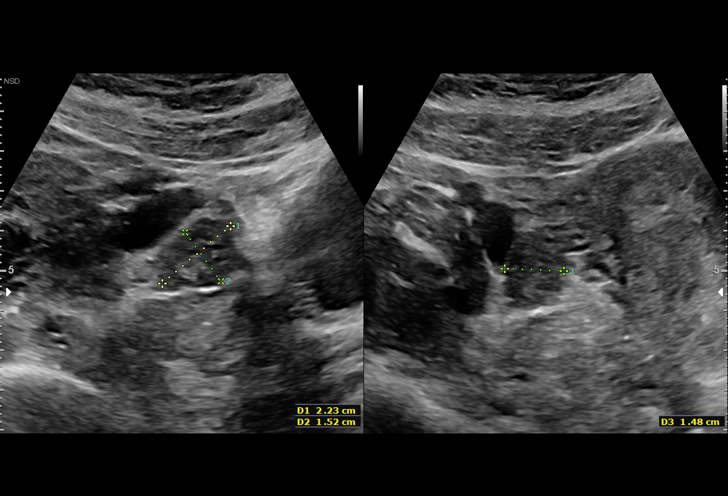
[im 10/27]
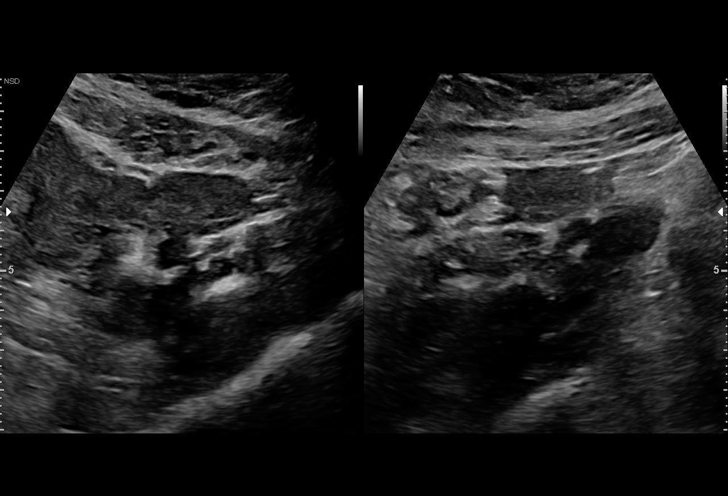
[im 12/27]
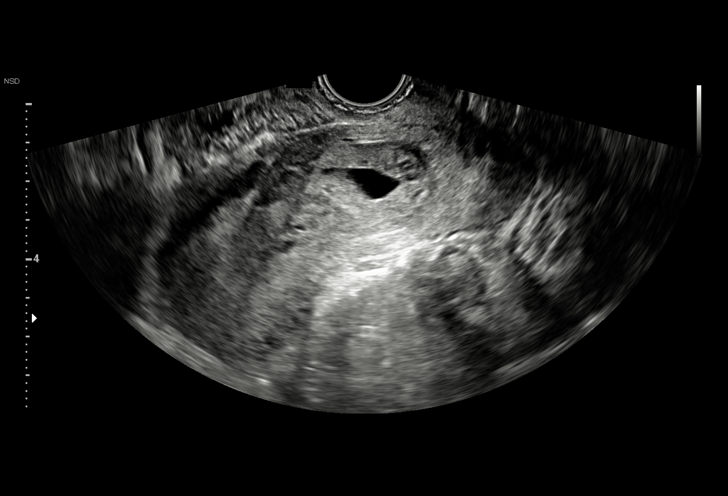
[im 14/27]
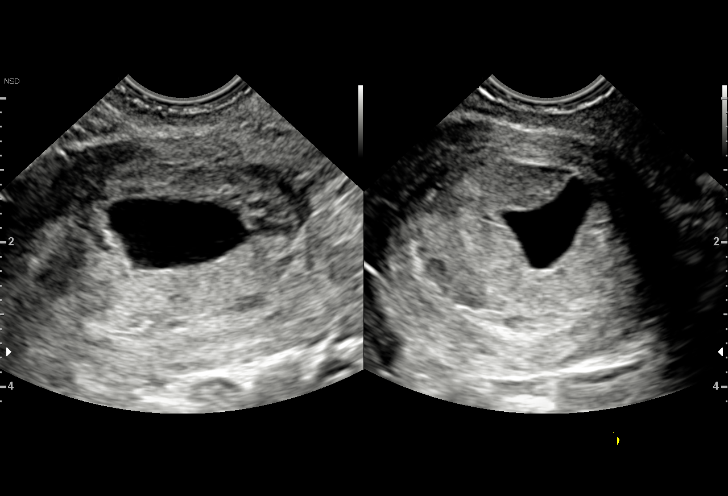
[im 16/27]
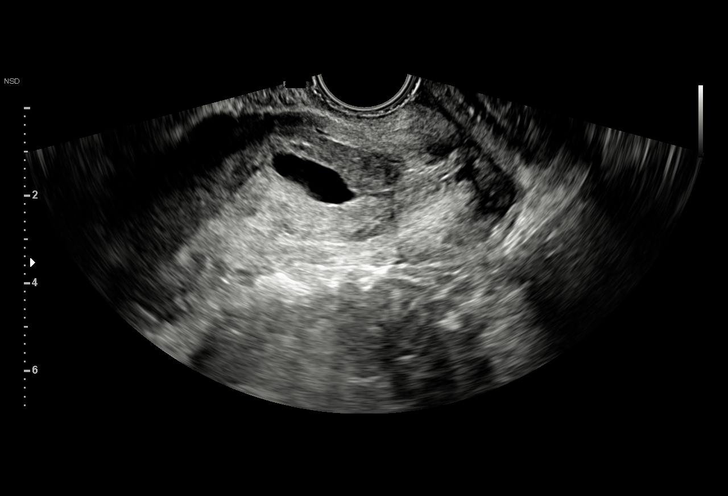
[im 18/27]
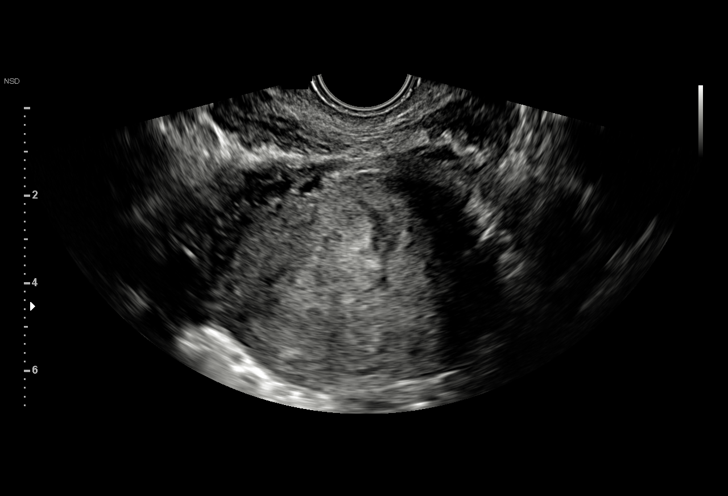
[im 19/27]
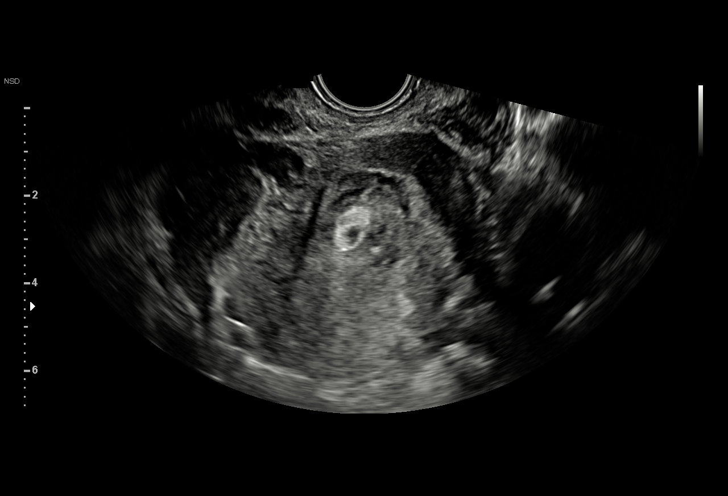
[im 21/27]
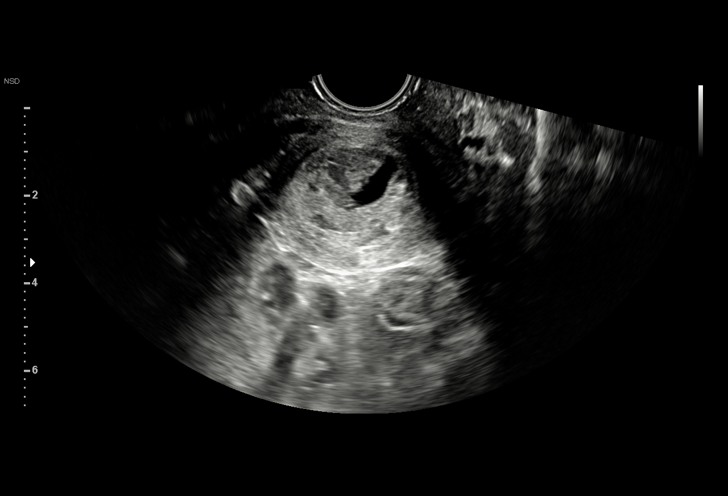
[im 23/27]
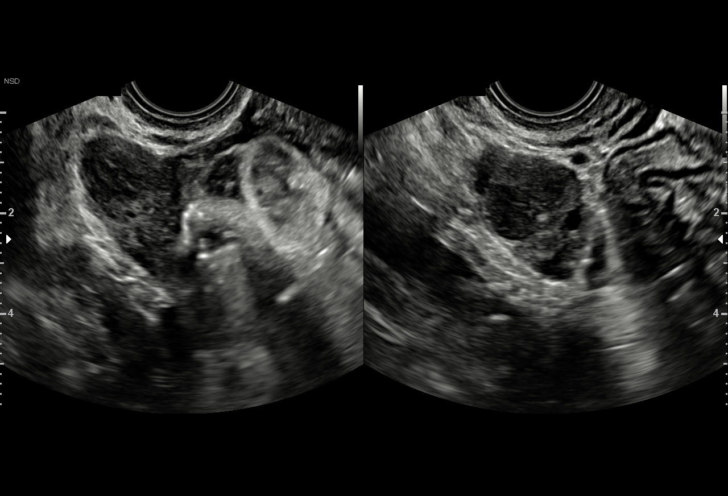
[im 25/27]
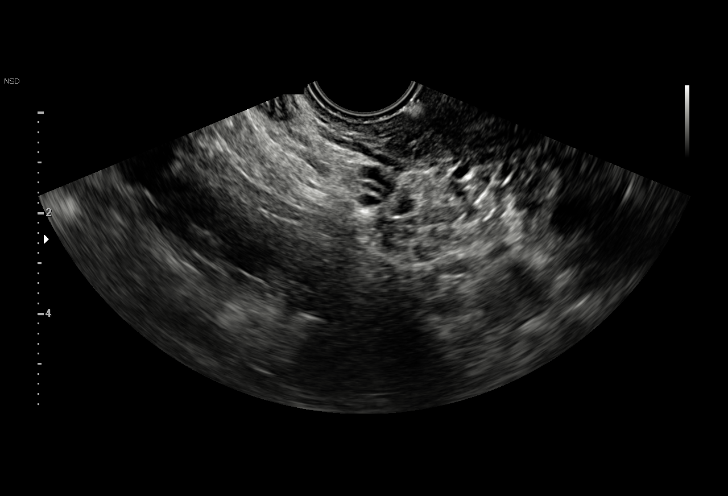
[im 27/27]
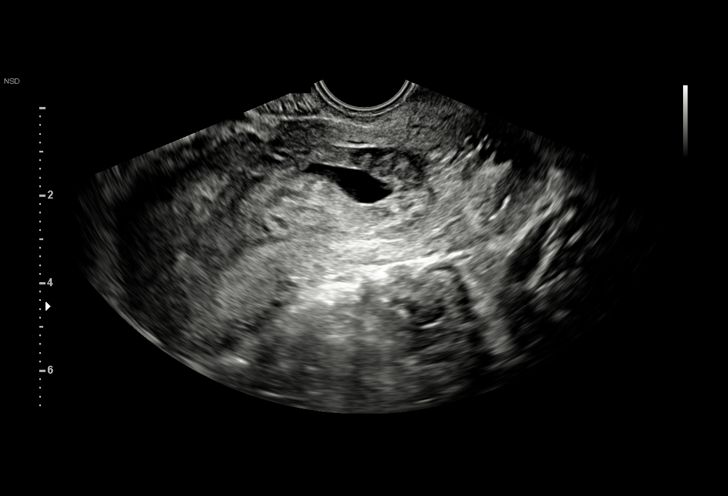

[15 of 27 positions shown; findings below may reference images not displayed]

FINDINGS: Intrauterine gestational sac: Probable single intrauterine
gestational sac with irregular contour in lower uterine segment

Yolk sac:  Not Visualized.

Embryo:  Not Visualized.

MSD: 13  mm   6 w   1  d

Subchorionic hemorrhage:  None visualized.

Maternal uterus/adnexae: Normal appearance of both ovaries. No mass
or free fluid identified.
IMPRESSION: Findings are suspicious but not yet definitive for failed pregnancy.
Recommend follow-up US in 10-14 days for definitive diagnosis. This
recommendation follows SRU consensus guidelines: Diagnostic Criteria
for Nonviable Pregnancy Early in the First Trimester. N Engl J Med

## 2018-11-15 ENCOUNTER — Encounter: Payer: Self-pay | Admitting: Family Medicine

## 2018-11-15 ENCOUNTER — Ambulatory Visit: Payer: BC Managed Care – PPO | Admitting: Family Medicine

## 2018-11-15 VITALS — BP 122/82 | HR 110 | Temp 98.6°F | Ht 61.0 in | Wt 185.2 lb

## 2018-11-15 DIAGNOSIS — J019 Acute sinusitis, unspecified: Secondary | ICD-10-CM | POA: Diagnosis not present

## 2018-11-15 MED ORDER — AZITHROMYCIN 250 MG PO TABS: ORAL_TABLET | ORAL | 0 refills | Status: DC

## 2018-11-16 ENCOUNTER — Encounter: Payer: Self-pay | Admitting: Family Medicine

## 2018-11-16 NOTE — Progress Notes (Signed)
   Subjective:    Patient ID: Cassidy [name], female    DOB: May 12, 1986, 33 y.o.   MRN: [id number]  HPI Here for one week of sinus pressure, PND, and coughing up yellow sputum. No fever.    Review of Systems  Constitutional: Negative.   HENT: Positive for congestion, postnasal drip and sinus pressure. Negative for sinus pain and sore throat.   Eyes: Negative.   Respiratory: Positive for cough.        Objective:   Physical Exam Constitutional:      Appearance: Normal appearance.  HENT:     Right Ear: Tympanic membrane and ear canal normal.     Left Ear: Tympanic membrane and ear canal normal.     Nose: Nose normal.     Mouth/Throat:     Pharynx: Oropharynx is clear.  Eyes:     Conjunctiva/sclera: Conjunctivae normal.  Pulmonary:     Effort: Pulmonary effort is normal. No respiratory distress.     Breath sounds: Normal breath sounds. No stridor. No wheezing, rhonchi or rales.  Lymphadenopathy:     Cervical: No cervical adenopathy.  Neurological:     Mental Status: She is alert.           Assessment & Plan:  Sinusitis, treat with a Zpack and Mucinex.  Gershon Crane, MD

## 2018-11-28 ENCOUNTER — Ambulatory Visit (INDEPENDENT_AMBULATORY_CARE_PROVIDER_SITE_OTHER): Payer: BC Managed Care – PPO | Admitting: Family Medicine

## 2018-11-28 ENCOUNTER — Encounter: Payer: Self-pay | Admitting: Family Medicine

## 2018-11-28 VITALS — BP 118/78 | HR 94 | Temp 98.2°F | Wt 185.2 lb

## 2018-11-28 DIAGNOSIS — I10 Essential (primary) hypertension: Secondary | ICD-10-CM

## 2018-11-28 MED ORDER — AMLODIPINE BESYLATE 5 MG PO TABS: 5.0000 mg | ORAL_TABLET | Freq: Every day | ORAL | 3 refills | Status: DC

## 2018-11-28 NOTE — Progress Notes (Signed)
Subjective:    Patient ID: Cassidy [name], female    DOB: 11/08/1985, 33 y.o.   MRN: [id number]  No chief complaint on file.   HPI Patient was seen today for f/u on HTN.  Pt taking norvasc 5 mg at night.  States checking bp twice a day.  Checks at work and then at home prior to taking meds.  Pt states her bp is "normal".  Pt states she has been doing well with norvasc.  Denies HAs, dizziness, CP, changes in vision.  Past Medical History:  Diagnosis Date  . 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 HSV infection   . Hypertension   . Hypothyroidism   . Migraine   . Pneumonia   . Pregnancy induced hypertension   . UTI (urinary tract infection)   . Vaginal Pap smear, abnormal     Allergies  Allergen Reactions  . Latex Rash    ROS General: Denies fever, chills, night sweats, changes in weight, changes in appetite HEENT: Denies headaches, ear pain, changes in vision, rhinorrhea, sore throat CV: Denies CP, palpitations, SOB, orthopnea Pulm: Denies SOB, cough, wheezing GI: Denies abdominal pain, nausea, vomiting, diarrhea, constipation GU: Denies dysuria, hematuria, frequency, vaginal discharge Msk: Denies muscle cramps, joint pains Neuro: Denies weakness, numbness, tingling Skin: Denies rashes, bruising Psych: Denies depression, anxiety, hallucinations    Objective:    Blood pressure 118/78, pulse 94, temperature 98.2 F (36.8 C), temperature source Oral, weight 185 lb 3.2 oz (84 kg), SpO2 99 %.  Pt is not breastfeeding.  Gen. well-nourished, well developed, in no distress, normal affect   HEENT: [place]/AT, face symmetric, no scleral icterus, PERRLA, nares patent without drainage Lungs: no accessory muscle use, CTAB, no wheezes or rales Cardiovascular: RRR, no m/r/g, no peripheral edema Neuro:  A&Ox3, CN II-XII intact, normal gait Skin:  Warm, no lesions/ rash  Wt Readings from Last 3 Encounters:   11/28/18 185 lb 3.2 oz (84 kg)  11/15/18 185 lb 3.2 oz (84 kg)  08/09/18 184 lb (83.5 kg)    Lab Results  Component Value Date   WBC 5.1 03/29/2018   HGB 10.1 (L) 03/29/2018   HCT 31.8 (L) 03/29/2018   PLT 328 03/29/2018   GLUCOSE 84 03/29/2018   CHOL 195 10/07/2017   TRIG 90 10/07/2017   HDL 40 10/07/2017   LDLCALC 137 (H) 10/07/2017   ALT 15 01/12/2018   AST 20 01/12/2018   NA 137 01/12/2018   K 3.3 (L) 01/12/2018   CL 108 01/12/2018   CREATININE 0.82 01/12/2018   BUN 9 01/12/2018   CO2 24 01/12/2018   TSH 2.96 03/29/2018    Assessment/Plan:  Essential hypertension  -controlled -continue lifestyle modifications - Plan: amLODipine (NORVASC) 5 MG tablet  F/u prn in the next 6 months  Abbe Amsterdam, MD

## 2019-03-08 ENCOUNTER — Other Ambulatory Visit: Payer: Self-pay

## 2019-03-08 ENCOUNTER — Encounter: Payer: Self-pay | Admitting: Family Medicine

## 2019-03-08 ENCOUNTER — Ambulatory Visit (INDEPENDENT_AMBULATORY_CARE_PROVIDER_SITE_OTHER): Payer: BC Managed Care – PPO | Admitting: Family Medicine

## 2019-03-08 DIAGNOSIS — I10 Essential (primary) hypertension: Secondary | ICD-10-CM | POA: Diagnosis not present

## 2019-03-08 DIAGNOSIS — G43009 Migraine without aura, not intractable, without status migrainosus: Secondary | ICD-10-CM | POA: Diagnosis not present

## 2019-03-08 MED ORDER — BUTALBITAL-APAP-CAFFEINE 50-325-40 MG PO TABS: 1.0000 | ORAL_TABLET | Freq: Four times a day (QID) | ORAL | 0 refills | Status: AC | PRN

## 2019-03-08 NOTE — Progress Notes (Signed)
Virtual Visit via Video Note  I connected with Cassidy [name] on 03/08/19 at 11:30 AM EDT by a video enabled telemedicine application and verified that I am speaking with the correct person using two identifiers.  Location patient: home Location provider:work or home office Persons participating in the virtual visit: patient, provider  I discussed the limitations of evaluation and management by telemedicine and the availability of in person appointments. The patient expressed understanding and agreed to proceed.   HPI: Pt with h/o migraines. Was on medication, but needs a refill.  Having a low grade HA x a few days, but had a migraine on Monday. Woke up with the HA.  Had throbbing pain all over, blurred vision, last all day.  Had to lay down which helped.  Taking extra strength tylenol on occasion.  Denies vomiting.  Had some nausea, but was "not unbearable" was still "able to eat".   HA mostly resolved.  May have a migraine once every few months.  Had a rx for fioricet-20 pills lasted a little more than 1 yr.  Drinking more water since working from home 2/2 COVID-19 pandemic.  Going to bed at midnight-1am and up by 6 or 630 am.    Checking bp nightly.  States has been in the normal range.  Only taking Norvasc 5 mg daily.    ROS: See pertinent positives and negatives per HPI.  Past Medical History:  Diagnosis Date  . 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 HSV infection   . Hypertension   . Hypothyroidism   . Migraine   . Pneumonia   . Pregnancy induced hypertension   . UTI (urinary tract infection)   . Vaginal Pap smear, abnormal     Past Surgical History:  Procedure Laterality Date  . BREAST BIOPSY    . CESAREAN SECTION     x2  . COLPOSCOPY  2005  . thyroid ablation  2017    Family History  Problem Relation Age of Onset  . Hypertension Father   . Hypertension Mother   . Diabetes Mother      SOCIAL HX: Pt working from home and taking care of her 2 small children 2/2 COVID-19 pandemic.   Current Outpatient Medications:  .  amLODipine (NORVASC) 5 MG tablet, Take 1 tablet (5 mg total) by mouth daily., Disp: 90 tablet, Rfl: 3 .  azithromycin (ZITHROMAX Z-PAK) 250 MG tablet, As directed, Disp: 6 each, Rfl: 0 .  labetalol (NORMODYNE) 300 MG tablet, Take 2 tablets (600 mg total) by mouth 3 (three) times daily., Disp: 180 tablet, Rfl: 2 .  levothyroxine (SYNTHROID, LEVOTHROID) 50 MCG tablet, Take 1 tablet (50 mcg total) by mouth daily before breakfast., Disp: 90 tablet, Rfl: 4  EXAM:  VITALS per patient if applicable:  RR between 12-20 bpm.  GENERAL: alert, oriented, appears well and in no acute distress  HEENT: atraumatic, conjunctiva clear, no obvious abnormalities on inspection of external nose and ears  NECK: normal movements of the head and neck  LUNGS: on inspection no signs of respiratory distress, breathing rate appears normal, no obvious gross SOB, gasping or wheezing  CV: no obvious cyanosis  MS: moves all visible extremities without noticeable abnormality  PSYCH/NEURO: pleasant and cooperative, no obvious depression or anxiety, speech and thought processing grossly intact  ASSESSMENT AND PLAN:  Discussed the following assessment and plan:  Migraine without aura and  without status migrainosus, not intractable  -discussed HA prevention.  Pt to work on sleep, decreasing stress, and water intake. -Discussed using Fioricet sparingly.  If frequency of HAs increases will use different medication. - Plan: butalbital-acetaminophen-caffeine (FIORICET) 50-325-40 MG tablet  Essential hypertension -controlled -continue norvasc 5 mg -continue lifestyle modifications  F/u prn  I discussed the assessment and treatment plan with the patient. The patient was provided an opportunity to ask questions and all were answered. The patient agreed with the plan and demonstrated  an understanding of the instructions.   The patient was advised to call back or seek an in-person evaluation if the symptoms worsen or if the condition fails to improve as anticipated.   Deeann Saint, MD

## 2019-04-05 ENCOUNTER — Telehealth: Payer: Self-pay | Admitting: Family Medicine

## 2019-04-05 NOTE — Telephone Encounter (Signed)
Pt sent in a mychart message stating: Need documentation of having hypertension to submit to employer.

## 2019-04-05 NOTE — Telephone Encounter (Signed)
[place] for note stating pt has a h/o HTN.

## 2019-04-05 NOTE — Telephone Encounter (Signed)
Ok for note 

## 2019-04-05 NOTE — Telephone Encounter (Signed)
Pt work note has been sent to pt MyChart portal

## 2019-04-09 NOTE — Telephone Encounter (Unsigned)
Copied from [place] [phone]. Topic: General - Inquiry >> Apr 02, 2019  4:19 PM Virl Axe D wrote: Reason for CRM: Pt stated she needs a letter from Dr. Volanda Napoleon for her employer stating that she has hypertension and is being treated for it. Please advise. TR#[phone]

## 2019-06-06 ENCOUNTER — Encounter: Payer: Self-pay | Admitting: Family Medicine

## 2019-06-15 ENCOUNTER — Telehealth: Payer: Self-pay | Admitting: Family Medicine

## 2019-06-15 NOTE — Telephone Encounter (Signed)
Copied from [place] [phone]. Topic: General - Other >> Jun 15, 2019 12:30 PM Keene Breath wrote: Reason for CRM: Patient called to request a referral to an Endo specialist.  Please call patient to discuss and let her know when it is approved.  CB# [phone]

## 2019-06-19 ENCOUNTER — Other Ambulatory Visit: Payer: Self-pay

## 2019-06-19 DIAGNOSIS — E039 Hypothyroidism, unspecified: Secondary | ICD-10-CM

## 2019-06-19 NOTE — Telephone Encounter (Signed)
Ok for referral?

## 2019-06-19 NOTE — Telephone Encounter (Signed)
Sure

## 2019-06-20 NOTE — Telephone Encounter (Signed)
Referral placed, pt is aware 

## 2019-07-09 ENCOUNTER — Other Ambulatory Visit: Payer: Self-pay

## 2019-07-11 ENCOUNTER — Ambulatory Visit: Payer: BC Managed Care – PPO | Admitting: Endocrinology

## 2019-07-11 ENCOUNTER — Encounter: Payer: Self-pay | Admitting: Endocrinology

## 2019-07-11 ENCOUNTER — Other Ambulatory Visit: Payer: Self-pay

## 2019-07-11 VITALS — BP 102/64 | HR 87 | Ht 61.0 in | Wt 180.6 lb

## 2019-07-11 DIAGNOSIS — E890 Postprocedural hypothyroidism: Secondary | ICD-10-CM

## 2019-07-11 DIAGNOSIS — E039 Hypothyroidism, unspecified: Secondary | ICD-10-CM | POA: Diagnosis not present

## 2019-07-11 LAB — T4, FREE: Free T4: 0.56 ng/dL — ABNORMAL LOW (ref 0.60–1.60)

## 2019-07-11 LAB — TSH: TSH: 4.88 u[IU]/mL — ABNORMAL HIGH (ref 0.35–4.50)

## 2019-07-11 MED ORDER — LEVOTHYROXINE SODIUM 50 MCG PO TABS: 50.0000 ug | ORAL_TABLET | Freq: Every day | ORAL | 3 refills | Status: DC

## 2019-07-11 NOTE — Progress Notes (Signed)
Subjective:    Patient ID: Cassidy [name], female    DOB: Oct 21, 1985, 33 y.o.   MRN: [id number]  HPI Pt is referred by Dr Salomon Fick, for hypothyroidism.  Pt reports she had RAI rx for hyperthyroidism in 2017, in [place], [place].  She has been on Synthroid since soon thereafter, until she ran out 1 month ago.  she has never taken kelp or any other type of non-prescribed thyroid product.  She is not at risk for another pregnancy.  she has never had thyroid surgery, or XRT to the neck.  she has never been on amiodarone or lithium.  She has slight "sensitivity" at the ant neck, and assoc weight gaiin. Past Medical History:  Diagnosis Date  . 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 HSV infection   . Hypertension   . Hypothyroidism   . Migraine   . Pneumonia   . Pregnancy induced hypertension   . UTI (urinary tract infection)   . Vaginal Pap smear, abnormal     Past Surgical History:  Procedure Laterality Date  . BREAST BIOPSY    . CESAREAN SECTION     x2  . COLPOSCOPY  2005  . thyroid ablation  2017    Social History   Socioeconomic History  . Marital status: Married    Spouse name: Not on file  . Number of children: Not on file  . Years of education: Not on file  . Highest education level: Not on file  Occupational History  . Not on file  Social Needs  . Financial resource strain: Not on file  . Food insecurity    Worry: Not on file    Inability: Not on file  . Transportation needs    Medical: Not on file    Non-medical: Not on file  Tobacco Use  . Smoking status: Never Smoker  . Smokeless tobacco: Never Used  Substance and Sexual Activity  . Alcohol use: No  . Drug use: No  . Sexual activity: Yes    Birth control/protection: None    Comment: declined insurancde questions  Lifestyle  . Physical activity    Days per week: Not on file    Minutes per session: Not on file  . Stress:  Not on file  Relationships  . Social Musician on phone: Not on file    Gets together: Not on file    Attends religious service: Not on file    Active member of club or organization: Not on file    Attends meetings of clubs or organizations: Not on file    Relationship status: Not on file  . Intimate partner violence    Fear of current or ex partner: Not on file    Emotionally abused: Not on file    Physically abused: Not on file    Forced sexual activity: Not on file  Other Topics Concern  . Not on file  Social History Narrative  . Not on file    Current Outpatient Medications on File Prior to Visit  Medication Sig Dispense Refill  . amLODipine (NORVASC) 5 MG tablet Take 1 tablet (5 mg total) by mouth daily. 90 tablet 3  . butalbital-acetaminophen-caffeine (FIORICET) 50-325-40 MG tablet Take 1-2 tablets by mouth every 6 (six) hours as needed for headache. 20 tablet 0   No current facility-administered medications on file prior to visit.  Allergies  Allergen Reactions  . Latex Rash    Family History  Problem Relation Age of Onset  . Hypertension Father   . Hypothyroidism Father   . Hypertension Mother   . Diabetes Mother   . Hypothyroidism Mother     BP 102/64 (BP Location: Left Arm, Patient Position: Sitting, Cuff Size: Large)   Pulse 87   Ht 5\' 1"  (1.549 m)   Wt 180 lb 9.6 oz (81.9 kg)   SpO2 100%   BMI 34.12 kg/m   Review of Systems denies depression, hair loss, muscle cramps, sob, memory loss, numbness, blurry vision, myalgias, easy bruising, and syncope.  she has fatigue, cold intolerance, dry skin, rhinorrhea, and constipation.       Objective:   Physical Exam VS: see vs page GEN: no distress HEAD: head: no deformity eyes: no periorbital swelling, no proptosis external nose and ears are normal NECK: supple, thyroid is not enlarged CHEST WALL: no deformity LUNGS: clear to auscultation CV: reg rate and rhythm, no murmur ABD: abdomen  is soft, nontender.  no hepatosplenomegaly.  not distended.  no hernia MUSCULOSKELETAL: muscle bulk and strength are grossly normal.  no obvious joint swelling.  gait is normal and steady EXTEMITIES: no deformity.  no ulcer on the feet.  feet are of normal color and temp.  no edema PULSES: dorsalis pedis intact bilat.  no carotid bruit NEURO:  cn 2-12 grossly intact.   readily moves all 4's.  sensation is intact to touch on the feet SKIN:  Normal texture and temperature.  No rash or suspicious lesion is visible.   NODES:  None palpable at the neck.   PSYCH: alert, well-oriented.  Does not appear anxious nor depressed.    nuc med scan: Normal 24 hour thyroid uptake of 15.5%.  In the presence of clinical and laboratory hyperthyroidism, this is consistent with thyroiditis (viral or autoimmune).  No evidence for toxic thyroid nodule.  I have reviewed outside records, and summarized:  Pt was noted to have hypothyroidism, and referred here.  Other probs addressed were HTN and migraine.    Lab Results  Component Value Date   TSH 4.88 (H) 07/11/2019      Assessment & Plan:  Hypothyroidism, new to me: she needs to resume rx.    Patient Instructions  Blood tests are requested for you today.  We'll let you know about the results.  Your have required less than a full daily replacement amount of thyroid hormone.  This means that the thyroid was not completely destroyed by the radioactive iodine.  Therefore you have some risk that the overactivity could come back.  this may take many years to happen.  If this happens, you would first notice that your medication requirement would decrease to none.  Then the overactivity would come back.  We'll follow your blood tests for this.  It is also possible that the thyroid could become more underactive, so we'll follow this.   Please come back for a follow-up appointment in 6 months

## 2019-07-11 NOTE — Patient Instructions (Addendum)
Blood tests are requested for you today.  We'll let you know about the results.  Your have required less than a full daily replacement amount of thyroid hormone.  This means that the thyroid was not completely destroyed by the radioactive iodine.  Therefore you have some risk that the overactivity could come back.  this may take many years to happen.  If this happens, you would first notice that your medication requirement would decrease to none.  Then the overactivity would come back.  We'll follow your blood tests for this.  It is also possible that the thyroid could become more underactive, so we'll follow this.   Please come back for a follow-up appointment in 6 months

## 2019-07-12 NOTE — Telephone Encounter (Signed)
Please advise 

## 2019-07-23 ENCOUNTER — Other Ambulatory Visit: Payer: Self-pay

## 2019-07-23 ENCOUNTER — Ambulatory Visit: Payer: BC Managed Care – PPO | Admitting: Women's Health

## 2019-07-23 ENCOUNTER — Encounter: Payer: Self-pay | Admitting: Women's Health

## 2019-07-23 VITALS — BP 126/80 | Ht 61.0 in | Wt 184.0 lb

## 2019-07-23 DIAGNOSIS — N921 Excessive and frequent menstruation with irregular cycle: Secondary | ICD-10-CM

## 2019-07-23 DIAGNOSIS — Z01419 Encounter for gynecological examination (general) (routine) without abnormal findings: Secondary | ICD-10-CM | POA: Diagnosis not present

## 2019-07-23 NOTE — Progress Notes (Signed)
Cassidy [name] 1986/01/14 [id number]    History:    Presents for annual exam.  Monthly cycle for 7 days heavy flow first 3 days and occasional midcycle spotting.  BTL.  States cycles are heavier than prior to children.  Hypertension, hypothyroidism primary care and endocrinologist manage.  History of HSV no outbreaks.  CIN-1 greater than 15 years ago with normal Paps after.  History of migraines.  Has had Gardasil.  Past medical history, past surgical history, family history and social history were all reviewed and documented in the EPIC chart.  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  Son 4, daughter 1-1/2 both doing well.  Mother diabetes, father hypertension.  ROS:  A ROS was performed and pertinent positives and negatives are included.  Exam:  Vitals:   07/23/19 1209  BP: 126/80  Weight: 184 lb (83.5 kg)  Height: 5\' 1"  (1.549 m)   Body mass index is 34.77 kg/m.   General appearance:  Normal Thyroid:  Symmetrical, normal in size, without palpable masses or nodularity. Respiratory  Auscultation:  Clear without wheezing or rhonchi Cardiovascular  Auscultation:  Regular rate, without rubs, murmurs or gallops  Edema/varicosities:  Not grossly evident Abdominal  Soft,nontender, without masses, guarding or rebound.  Liver/spleen:  No organomegaly noted  Hernia:  None appreciated  Skin  Inspection:  Grossly normal   Breasts: Examined lying and sitting.     Right: Without masses, retractions, discharge or axillary adenopathy.     Left: Without masses, retractions, discharge or axillary adenopathy. Gentitourinary   Inguinal/mons:  Normal without inguinal adenopathy  External genitalia:  Normal  BUS/Urethra/Skene's glands:  Normal  Vagina:  Normal  Cervix:  Normal  Uterus:  normal in size, shape and contour.  Midline and mobile  Adnexa/parametria:     Rt: Without masses or tenderness.   Lt: Without masses or tenderness.  Anus and perineum: Normal  Digital rectal exam: Normal  sphincter tone without palpated masses or tenderness  Assessment/Plan:  33 y.o. MBF G3 P2 for annual exam with complaint of heavy cycles minimal cramping.  Monthly 7 day cycle with 3 days menorrhagia and mid cycle spotting /BTL Hypertension and migraines -primary care manages meds and labs Hypothyroidism-endocrinologist manages HSV no outbreaks Obesity Greater than 15 years ago CIN-1 with normal Paps after.  Pain: Options reviewed, will schedule sonohysterogram with Dr. Dellis Filbert after next cycle, reviewed possible endometrial ablation.  SBEs, annual screening mammogram at 40, increase regular exercise and decrease calories/carbs and vitamin D 1000 daily encouraged.    2019 Pap ASCUS with negative HR HPV , screening guidelines reviewed      Huel Cote [facility], 12:59 PM 07/23/2019

## 2019-07-23 NOTE — Patient Instructions (Signed)
Endometrial Ablation Endometrial ablation is a procedure that destroys the thin inner layer of the lining of the uterus (endometrium). This procedure may be done:  To stop heavy periods.  To stop bleeding that is causing anemia.  To control irregular bleeding.  To treat bleeding caused by small tumors (fibroids) in the endometrium. This procedure is often an alternative to major surgery, such as removal of the uterus and cervix (hysterectomy). As a result of this procedure:  You may not be able to have children. However, if you are premenopausal (you have not gone through menopause): ? You may still have a small chance of getting pregnant. ? You will need to use a reliable method of birth control after the procedure to prevent pregnancy.  You may stop having a menstrual period, or you may have only a small amount of bleeding during your period. Menstruation may return several years after the procedure. Tell a health care provider about:  Any allergies you have.  All medicines you are taking, including vitamins, herbs, eye drops, creams, and over-the-counter medicines.  Any problems you or family members have had with the use of anesthetic medicines.  Any blood disorders you have.  Any surgeries you have had.  Any medical conditions you have. What are the risks? Generally, this is a safe procedure. However, problems may occur, including:  A hole (perforation) in the uterus or bowel.  Infection of the uterus, bladder, or vagina.  Bleeding.  Damage to other structures or organs.  An air bubble in the lung (air embolus).  Problems with pregnancy after the procedure.  Failure of the procedure.  Decreased ability to diagnose cancer in the endometrium. What happens before the procedure?  You will have tests of your endometrium to make sure there are no pre-cancerous cells or cancer cells present.  You may have an ultrasound of the uterus.  You may be given medicines to  thin the endometrium.  Ask your health care provider about: ? Changing or stopping your regular medicines. This is especially important if you take diabetes medicines or blood thinners. ? Taking medicines such as aspirin and ibuprofen. These medicines can thin your blood. Do not take these medicines before your procedure if your doctor tells you not to.  Plan to have someone take you home from the hospital or clinic. What happens during the procedure?   You will lie on an exam table with your feet and legs supported as in a pelvic exam.  To lower your risk of infection: ? Your health care team will wash or sanitize their hands and put on germ-free (sterile) gloves. ? Your genital area will be washed with soap.  An IV tube will be inserted into one of your veins.  You will be given a medicine to help you relax (sedative).  A surgical instrument with a light and camera (resectoscope) will be inserted into your vagina and moved into your uterus. This allows your surgeon to see inside your uterus.  Endometrial tissue will be removed using one of the following methods: ? Radiofrequency. This method uses a radiofrequency-alternating electric current to remove the endometrium. ? Cryotherapy. This method uses extreme cold to freeze the endometrium. ? Heated-free liquid. This method uses a heated saltwater (saline) solution to remove the endometrium. ? Microwave. This method uses high-energy microwaves to heat up the endometrium and remove it. ? Thermal balloon. This method involves inserting a catheter with a balloon tip into the uterus. The balloon tip is filled with   heated fluid to remove the endometrium. The procedure may vary among health care providers and hospitals. What happens after the procedure?  Your blood pressure, heart rate, breathing rate, and blood oxygen level will be monitored until the medicines you were given have worn off.  As tissue healing occurs, you may notice  vaginal bleeding for 4-6 weeks after the procedure. You may also experience: ? Cramps. ? Thin, watery vaginal discharge that is light pink or brown in color. ? A need to urinate more frequently than usual. ? Nausea.  Do not drive for 24 hours if you were given a sedative.  Do not have sex or insert anything into your vagina until your health care provider approves. Summary  Endometrial ablation is done to treat the many causes of heavy menstrual bleeding.  The procedure may be done only after medications have been tried to control the bleeding.  Plan to have someone take you home from the hospital or clinic. This information is not intended to replace advice given to you by your health care provider. Make sure you discuss any questions you have with your health care provider. Document Released: 07/30/2004 Document Revised: 03/07/2018 Document Reviewed: 10/07/2016 Elsevier Patient Education  2020 [employer]  

## 2019-08-20 ENCOUNTER — Other Ambulatory Visit: Payer: Self-pay

## 2019-08-21 ENCOUNTER — Ambulatory Visit (INDEPENDENT_AMBULATORY_CARE_PROVIDER_SITE_OTHER): Payer: BC Managed Care – PPO

## 2019-08-21 ENCOUNTER — Encounter: Payer: Self-pay | Admitting: Obstetrics & Gynecology

## 2019-08-21 ENCOUNTER — Other Ambulatory Visit: Payer: Self-pay | Admitting: Obstetrics & Gynecology

## 2019-08-21 ENCOUNTER — Ambulatory Visit: Payer: BC Managed Care – PPO | Admitting: Obstetrics & Gynecology

## 2019-08-21 DIAGNOSIS — R9389 Abnormal findings on diagnostic imaging of other specified body structures: Secondary | ICD-10-CM | POA: Diagnosis not present

## 2019-08-21 DIAGNOSIS — N921 Excessive and frequent menstruation with irregular cycle: Secondary | ICD-10-CM

## 2019-08-21 DIAGNOSIS — Z9851 Tubal ligation status: Secondary | ICD-10-CM

## 2019-08-21 DIAGNOSIS — N840 Polyp of corpus uteri: Secondary | ICD-10-CM

## 2019-08-21 NOTE — Patient Instructions (Signed)
1. Menorrhagia with irregular cycle Menorrhagia with irregular cycle including metrorrhagia.  Pelvic ultrasound findings as well as sonohysterogram findings reviewed with patient.  2 intra uterine masses seen on sonohysterogram compatible with endometrial polyps.  2. Thickened endometrium As above, sonohysterogram showing probable endometrial polyps x2  3. Endometrial polyp 2 endometrial polyps, both measured at 1.3 cm.  Findings reviewed and surgical removal recommended.  Will schedule a HSC/Myosure Excision of Polyps/D+C.  Information and pamphlet given.  Follow-up preop.  4. S/P tubal ligation  Cassidy [name], it was a pleasure meeting you today!

## 2019-08-21 NOTE — Progress Notes (Signed)
Cassidy [name] Feb 28, 1986 [id number]        33 y.o.  [id number]  S/P TL.  RP: Menometrorrhagia worsening recently presenting for pelvic ultrasound/sonohysterogram  HPI: Patient has had heavy menses for a long time but recently she is having more irregular vaginal bleeding including frequent metrorrhagia.  No pelvic pain except for cramping when bleeding.  No pain with intercourse.  Status post tubal ligation.   OB History  Gravida Para Term Preterm AB Living  3 1   1 1 2   SAB TAB Ectopic Multiple Live Births  1       1    # Outcome Date GA Lbr Len/2nd Weight Sex Delivery Anes PTL Lv  3 Preterm 04/30/14 [redacted]w[redacted]d    CS-LTranv        Complications: Pre-eclampsia  2 Gravida           1 SAB             Past medical history,surgical history, problem list, medications, allergies, family history and social history were all reviewed and documented in the EPIC chart.   Directed ROS with pertinent positives and negatives documented in the history of present illness/assessment and plan.  Exam:  There were no vitals filed for this visit. General appearance:  Normal                                                                    Sono Infusion Hysterogram ( procedure note)   The initial transvaginal ultrasound demonstrated the following: T/V images.  Latex precautions used.  Anteverted uterus, retroflexed at the C-section scar.  No myometrial mass.  Uterine size measured at 7.72 x 3.86 x 3.67 cm.  Irregularly thickened endometrial lining measured at 15 mm.  Feeder vessel identified to area of thickening with 3D imaging suggestive of at least 2 endometrial masses, 9 mm each.  Both ovaries normal in size and appearance with a follicular pattern.  No adnexal mass seen.  No free fluid in the posterior cul-de-sac.  The speculum  was inserted and the cervix cleansed with Betadine solution after confirming that patient has no allergies.A small sonohysterography catheterwas utilized.   Insertion was facilitated with ring forceps, using a spear-like motion the catheter was inserted to the fundus of the uterus. The speculum is then removed carefully to avoid dislodging the catheter. The catheter was flushed with sterile saline delete prior to insertion to rid it of small amounts of air.the sterile saline solution was infused into the uterine cavity as a vaginal ultrasound probe was then placed in the vagina for full visualization of the uterine cavity from a transvaginal approach. The following was noted: Saline infusion confirming 2 intracavitary masses measuring 1.3 x 0.8 cm and 1.3 x 0.4 cm.  Otherwise smooth thin cavity walls measured at 4.0 mm.  The catheter was then removed after retrieving some of the saline from the intrauterine cavity. An endometrial biopsy was not done. Patient tolerated procedure well. She had received a tablet of Aleve for discomfort.   Hemoglobin 10.1 in June 2019   Assessment/Plan:  33 y.o. 20   1. Menorrhagia with irregular cycle Menorrhagia with irregular cycle including metrorrhagia.  Pelvic ultrasound findings as well as sonohysterogram findings reviewed with patient.  2 intra uterine masses seen on sonohysterogram compatible with endometrial polyps.  2. Thickened endometrium As above, sonohysterogram showing probable endometrial polyps x2  3. Endometrial polyp 2 endometrial polyps, both measured at 1.3 cm.  Findings reviewed and surgical removal recommended.  Will schedule a HSC/Myosure Excision of Polyps/D+C.  Information and pamphlet given.  Follow-up preop.  4. S/P tubal ligation  Counseling on above issues and coordination of care more than 50% for 15 minutes.  Princess Bruins MD, 12:39 PM 08/21/2019

## 2019-08-22 ENCOUNTER — Telehealth: Payer: Self-pay

## 2019-08-22 NOTE — Telephone Encounter (Signed)
I spoke with patient about her insurance benefits for surgery.  We discussed my soonest dates are mid Dec.  She has not met any of her deductible and her plan starts on 1/1.  I explained that her deductible will start over then as well. We discussed this and patient wants to wait to meet her deductible in Jan. I will call her with Jan schedule.

## 2019-09-04 ENCOUNTER — Other Ambulatory Visit: Payer: Self-pay

## 2019-09-04 NOTE — Telephone Encounter (Signed)
I called patient with January dates and scheduled her for 10/19/19. I scheduled her Covid screen accordingly and reviewed quarantine instructions with her.  We reviewed again her ins benefits currently and she does not think that will change with new year. I once again discussed her estimated surgery prepymt amount due by week before surgery.  Packet will be mailed.

## 2019-09-17 ENCOUNTER — Encounter: Payer: Self-pay | Admitting: Anesthesiology

## 2019-10-16 ENCOUNTER — Other Ambulatory Visit (HOSPITAL_COMMUNITY)
Admission: RE | Admit: 2019-10-16 | Discharge: 2019-10-16 | Disposition: A | Discharge status: Home / Self Care | Payer: BC Managed Care – PPO | Source: Ambulatory Visit | Attending: Obstetrics & Gynecology | Admitting: Obstetrics & Gynecology

## 2019-10-16 ENCOUNTER — Other Ambulatory Visit: Payer: Self-pay

## 2019-10-16 DIAGNOSIS — Z20822 Contact with and (suspected) exposure to covid-19: Secondary | ICD-10-CM | POA: Insufficient documentation

## 2019-10-16 DIAGNOSIS — Z01812 Encounter for preprocedural laboratory examination: Secondary | ICD-10-CM | POA: Diagnosis present

## 2019-10-17 ENCOUNTER — Encounter (HOSPITAL_BASED_OUTPATIENT_CLINIC_OR_DEPARTMENT_OTHER): Payer: Self-pay | Admitting: Obstetrics & Gynecology

## 2019-10-17 ENCOUNTER — Other Ambulatory Visit: Payer: Self-pay

## 2019-10-17 NOTE — Progress Notes (Signed)
Spoke w/ via phone for pre-op interview---Cassidy [name] needs dos----    Cbc,  urine preg , I stat 8 ekg         [name] results------ COVID test ------10-16-2019 Arrive at -------530 am 10-19-2019 NPO after ------midnight Medications to take morning of surgery -----none Diabetic medication -----n/a Patient Special Instructions ----- Pre-Op special Istructions ----- Patient verbalized understanding of instructions that were given at this phone interview. Patient denies shortness of breath, chest pain, fever, cough a this phone interview.

## 2019-10-18 LAB — NOVEL CORONAVIRUS, NAA (HOSP ORDER, SEND-OUT TO REF LAB; TAT 18-24 HRS): SARS-CoV-2, NAA: NOT DETECTED

## 2019-10-18 NOTE — Anesthesia Preprocedure Evaluation (Addendum)
Anesthesia Evaluation  Patient identified by MRN, date of birth, ID band Patient awake    Reviewed: Allergy & Precautions, NPO status , Patient's Chart, lab work & pertinent test results  History of Anesthesia Complications Negative for: history of anesthetic complications  Airway Mallampati: II  TM Distance: >3 FB Neck ROM: Full    Dental  (+) Dental Advisory Given, Teeth Intact   Pulmonary neg pulmonary ROS,    Pulmonary exam normal        Cardiovascular hypertension, Pt. on medications Normal cardiovascular exam     Neuro/Psych  Headaches, negative psych ROS   GI/Hepatic negative GI ROS, Neg liver ROS,   Endo/Other  Hypothyroidism  Obesity   Renal/GU negative Renal ROS     Musculoskeletal negative musculoskeletal ROS (+)   Abdominal   Peds  Hematology negative hematology ROS (+)   Anesthesia Other Findings HSV Covid neg 1/12   Reproductive/Obstetrics                            Anesthesia Physical Anesthesia Plan  ASA: II  Anesthesia Plan: General   Post-op Pain Management:    Induction: Intravenous  PONV Risk Score and Plan: 3 and Treatment may vary due to age or medical condition, Ondansetron, Midazolam and Dexamethasone  Airway Management Planned: LMA  Additional Equipment: None  Intra-op Plan:   Post-operative Plan: Extubation in OR  Informed Consent: I have reviewed the patients History and Physical, chart, labs and discussed the procedure including the risks, benefits and alternatives for the proposed anesthesia with the patient or authorized representative who has indicated his/her understanding and acceptance.     Dental advisory given  Plan Discussed with: CRNA and Anesthesiologist  Anesthesia Plan Comments:        Anesthesia Quick Evaluation

## 2019-10-19 ENCOUNTER — Ambulatory Visit (HOSPITAL_BASED_OUTPATIENT_CLINIC_OR_DEPARTMENT_OTHER)
Admission: RE | Admit: 2019-10-19 | Discharge: 2019-10-19 | Disposition: A | Discharge status: Home / Self Care | Payer: BC Managed Care – PPO | Attending: Obstetrics & Gynecology | Admitting: Obstetrics & Gynecology

## 2019-10-19 ENCOUNTER — Other Ambulatory Visit: Payer: Self-pay

## 2019-10-19 ENCOUNTER — Encounter (HOSPITAL_BASED_OUTPATIENT_CLINIC_OR_DEPARTMENT_OTHER): Payer: Self-pay | Admitting: Obstetrics & Gynecology

## 2019-10-19 ENCOUNTER — Encounter (HOSPITAL_BASED_OUTPATIENT_CLINIC_OR_DEPARTMENT_OTHER)
Admission: RE | Disposition: A | Discharge status: Home / Self Care | Payer: Self-pay | Source: Home / Self Care | Attending: Obstetrics & Gynecology

## 2019-10-19 ENCOUNTER — Ambulatory Visit (HOSPITAL_BASED_OUTPATIENT_CLINIC_OR_DEPARTMENT_OTHER): Payer: BC Managed Care – PPO | Admitting: Anesthesiology

## 2019-10-19 DIAGNOSIS — Z79899 Other long term (current) drug therapy: Secondary | ICD-10-CM | POA: Insufficient documentation

## 2019-10-19 DIAGNOSIS — N921 Excessive and frequent menstruation with irregular cycle: Secondary | ICD-10-CM | POA: Insufficient documentation

## 2019-10-19 DIAGNOSIS — Z87410 Personal history of cervical dysplasia: Secondary | ICD-10-CM | POA: Insufficient documentation

## 2019-10-19 DIAGNOSIS — Z9851 Tubal ligation status: Secondary | ICD-10-CM | POA: Diagnosis not present

## 2019-10-19 DIAGNOSIS — N840 Polyp of corpus uteri: Secondary | ICD-10-CM

## 2019-10-19 DIAGNOSIS — N804 Endometriosis of rectovaginal septum and vagina: Secondary | ICD-10-CM | POA: Insufficient documentation

## 2019-10-19 DIAGNOSIS — E669 Obesity, unspecified: Secondary | ICD-10-CM | POA: Diagnosis not present

## 2019-10-19 DIAGNOSIS — Z79890 Hormone replacement therapy (postmenopausal): Secondary | ICD-10-CM | POA: Diagnosis not present

## 2019-10-19 DIAGNOSIS — G43909 Migraine, unspecified, not intractable, without status migrainosus: Secondary | ICD-10-CM | POA: Diagnosis not present

## 2019-10-19 DIAGNOSIS — E890 Postprocedural hypothyroidism: Secondary | ICD-10-CM | POA: Diagnosis not present

## 2019-10-19 DIAGNOSIS — I10 Essential (primary) hypertension: Secondary | ICD-10-CM | POA: Diagnosis not present

## 2019-10-19 DIAGNOSIS — Z6834 Body mass index (BMI) 34.0-34.9, adult: Secondary | ICD-10-CM | POA: Diagnosis not present

## 2019-10-19 HISTORY — PX: DILATATION & CURETTAGE/HYSTEROSCOPY WITH MYOSURE: SHX6511

## 2019-10-19 LAB — POCT I-STAT, CHEM 8
BUN: 11 mg/dL (ref 6–20)
Calcium, Ion: 1.17 mmol/L (ref 1.15–1.40)
Chloride: 108 mmol/L (ref 98–111)
Creatinine, Ser: 0.8 mg/dL (ref 0.44–1.00)
Glucose, Bld: 94 mg/dL (ref 70–99)
HCT: 31 % — ABNORMAL LOW (ref 36.0–46.0)
Hemoglobin: 10.5 g/dL — ABNORMAL LOW (ref 12.0–15.0)
Potassium: 3.4 mmol/L — ABNORMAL LOW (ref 3.5–5.1)
Sodium: 141 mmol/L (ref 135–145)
TCO2: 22 mmol/L (ref 22–32)

## 2019-10-19 LAB — CBC
HCT: 30.3 % — ABNORMAL LOW (ref 36.0–46.0)
Hemoglobin: 8.8 g/dL — ABNORMAL LOW (ref 12.0–15.0)
MCH: 22 pg — ABNORMAL LOW (ref 26.0–34.0)
MCHC: 29 g/dL — ABNORMAL LOW (ref 30.0–36.0)
MCV: 75.8 fL — ABNORMAL LOW (ref 80.0–100.0)
Platelets: 392 10*3/uL (ref 150–400)
RBC: 4 MIL/uL (ref 3.87–5.11)
RDW: 17.7 % — ABNORMAL HIGH (ref 11.5–15.5)
WBC: 6.7 10*3/uL (ref 4.0–10.5)
nRBC: 0 % (ref 0.0–0.2)

## 2019-10-19 LAB — POCT PREGNANCY, URINE: Preg Test, Ur: NEGATIVE

## 2019-10-19 SURGERY — DILATATION & CURETTAGE/HYSTEROSCOPY WITH MYOSURE
Anesthesia: General

## 2019-10-19 MED ORDER — ACETAMINOPHEN 500 MG PO TABS
ORAL_TABLET | ORAL | Status: AC
  Filled 2019-10-19: qty 2

## 2019-10-19 MED ORDER — PROPOFOL 10 MG/ML IV BOLUS
INTRAVENOUS | Status: AC
  Filled 2019-10-19: qty 40

## 2019-10-19 MED ORDER — ARTIFICIAL TEARS OPHTHALMIC OINT
TOPICAL_OINTMENT | OPHTHALMIC | Status: AC
  Filled 2019-10-19: qty 3.5

## 2019-10-19 MED ORDER — OXYCODONE HCL 5 MG PO TABS
5.0000 mg | ORAL_TABLET | Freq: Once | ORAL | Status: DC | PRN
  Filled 2019-10-19: qty 1

## 2019-10-19 MED ORDER — PROMETHAZINE HCL 25 MG/ML IJ SOLN
6.2500 mg | INTRAMUSCULAR | Status: DC | PRN
  Filled 2019-10-19: qty 1

## 2019-10-19 MED ORDER — ONDANSETRON HCL 4 MG/2ML IJ SOLN
INTRAMUSCULAR | Status: DC | PRN
  Administered 2019-10-19: 4 mg via INTRAVENOUS

## 2019-10-19 MED ORDER — PROPOFOL 10 MG/ML IV BOLUS
INTRAVENOUS | Status: DC | PRN
  Administered 2019-10-19: 200 mg via INTRAVENOUS

## 2019-10-19 MED ORDER — FENTANYL CITRATE (PF) 100 MCG/2ML IJ SOLN
INTRAMUSCULAR | Status: DC | PRN
  Administered 2019-10-19: 50 ug via INTRAVENOUS

## 2019-10-19 MED ORDER — MIDAZOLAM HCL 2 MG/2ML IJ SOLN
INTRAMUSCULAR | Status: DC | PRN
  Administered 2019-10-19: 2 mg via INTRAVENOUS

## 2019-10-19 MED ORDER — ACETAMINOPHEN 325 MG PO TABS
ORAL_TABLET | ORAL | Status: DC | PRN
  Administered 2019-10-19: 1000 mg via ORAL

## 2019-10-19 MED ORDER — MIDAZOLAM HCL 2 MG/2ML IJ SOLN
INTRAMUSCULAR | Status: AC
  Filled 2019-10-19: qty 2

## 2019-10-19 MED ORDER — KETOROLAC TROMETHAMINE 30 MG/ML IJ SOLN
INTRAMUSCULAR | Status: DC | PRN
  Administered 2019-10-19: 30 mg via INTRAVENOUS

## 2019-10-19 MED ORDER — FENTANYL CITRATE (PF) 100 MCG/2ML IJ SOLN
25.0000 ug | INTRAMUSCULAR | Status: DC | PRN
  Filled 2019-10-19: qty 1

## 2019-10-19 MED ORDER — ONDANSETRON HCL 4 MG/2ML IJ SOLN
INTRAMUSCULAR | Status: AC
  Filled 2019-10-19: qty 2

## 2019-10-19 MED ORDER — CEFAZOLIN SODIUM-DEXTROSE 2-4 GM/100ML-% IV SOLN
INTRAVENOUS | Status: AC
  Filled 2019-10-19: qty 100

## 2019-10-19 MED ORDER — LIDOCAINE 2% (20 MG/ML) 5 ML SYRINGE
INTRAMUSCULAR | Status: AC
  Filled 2019-10-19: qty 5

## 2019-10-19 MED ORDER — LIDOCAINE HCL 1 % IJ SOLN
INTRAMUSCULAR | Status: DC | PRN
  Administered 2019-10-19: 20 mL

## 2019-10-19 MED ORDER — DEXAMETHASONE SODIUM PHOSPHATE 10 MG/ML IJ SOLN
INTRAMUSCULAR | Status: AC
  Filled 2019-10-19: qty 1

## 2019-10-19 MED ORDER — FENTANYL CITRATE (PF) 100 MCG/2ML IJ SOLN
INTRAMUSCULAR | Status: AC
  Filled 2019-10-19: qty 2

## 2019-10-19 MED ORDER — LIDOCAINE 2% (20 MG/ML) 5 ML SYRINGE
INTRAMUSCULAR | Status: DC | PRN
  Administered 2019-10-19: 80 mg via INTRAVENOUS

## 2019-10-19 MED ORDER — CEFAZOLIN SODIUM-DEXTROSE 2-4 GM/100ML-% IV SOLN
2.0000 g | INTRAVENOUS | Status: AC
  Administered 2019-10-19: 2 g via INTRAVENOUS
  Filled 2019-10-19: qty 100

## 2019-10-19 MED ORDER — KETOROLAC TROMETHAMINE 30 MG/ML IJ SOLN
INTRAMUSCULAR | Status: AC
  Filled 2019-10-19: qty 1

## 2019-10-19 MED ORDER — LACTATED RINGERS IV SOLN
INTRAVENOUS | Status: DC
  Filled 2019-10-19: qty 1000

## 2019-10-19 MED ORDER — LACTATED RINGERS IV SOLN
INTRAVENOUS | Status: DC
  Filled 2019-10-19 (×2): qty 1000

## 2019-10-19 MED ORDER — DEXAMETHASONE SODIUM PHOSPHATE 10 MG/ML IJ SOLN
INTRAMUSCULAR | Status: DC | PRN
  Administered 2019-10-19: 5 mg via INTRAVENOUS

## 2019-10-19 MED ORDER — OXYCODONE HCL 5 MG/5ML PO SOLN
5.0000 mg | Freq: Once | ORAL | Status: DC | PRN
  Filled 2019-10-19: qty 5

## 2019-10-19 SURGICAL SUPPLY — 25 items
CANISTER SUCT 3000ML PPV (MISCELLANEOUS) ×3 IMPLANT
CATH ROBINSON RED A/P 16FR (CATHETERS) ×3 IMPLANT
COVER WAND RF STERILE (DRAPES) ×3 IMPLANT
DEVICE MYOSURE LITE (MISCELLANEOUS) IMPLANT
DEVICE MYOSURE REACH (MISCELLANEOUS) ×2 IMPLANT
DILATOR CANAL MILEX (MISCELLANEOUS) IMPLANT
ELECT REM PT RETURN 9FT ADLT (ELECTROSURGICAL)
ELECTRODE REM PT RTRN 9FT ADLT (ELECTROSURGICAL) IMPLANT
GAUZE 4X4 16PLY RFD (DISPOSABLE) ×3 IMPLANT
GLOVE BIO SURGEON STRL SZ 6.5 (GLOVE) ×2 IMPLANT
GLOVE BIO SURGEONS STRL SZ 6.5 (GLOVE) ×1
GLOVE BIOGEL PI IND STRL 7.0 (GLOVE) ×2 IMPLANT
GLOVE BIOGEL PI INDICATOR 7.0 (GLOVE) ×4
GOWN STRL REUS W/TWL LRG LVL3 (GOWN DISPOSABLE) ×6 IMPLANT
IV NS IRRIG 3000ML ARTHROMATIC (IV SOLUTION) ×6 IMPLANT
KIT PROCEDURE FLUENT (KITS) ×3 IMPLANT
MYOSURE XL FIBROID (MISCELLANEOUS)
PACK VAGINAL MINOR WOMEN LF (CUSTOM PROCEDURE TRAY) ×3 IMPLANT
PAD OB MATERNITY 4.3X12.25 (PERSONAL CARE ITEMS) ×3 IMPLANT
PAD PREP 24X48 CUFFED NSTRL (MISCELLANEOUS) ×3 IMPLANT
PENCIL BUTTON HOLSTER BLD 10FT (ELECTRODE) ×2 IMPLANT
SEAL CERVICAL OMNI LOK (ABLATOR) IMPLANT
SEAL ROD LENS SCOPE MYOSURE (ABLATOR) ×3 IMPLANT
SYSTEM TISS REMOVAL MYOSURE XL (MISCELLANEOUS) IMPLANT
TOWEL OR 17X26 10 PK STRL BLUE (TOWEL DISPOSABLE) ×6 IMPLANT

## 2019-10-19 NOTE — Anesthesia Procedure Notes (Signed)
Procedure Name: LMA Insertion Date/Time: 10/19/2019 7:27 AM Performed by: Tyrone Nine, CRNA Pre-anesthesia Checklist: Patient identified, Timeout performed, Emergency Drugs available, Suction available and Patient being monitored Patient Re-evaluated:Patient Re-evaluated prior to induction Oxygen Delivery Method: Circle system utilized Preoxygenation: Pre-oxygenation with 100% oxygen Induction Type: IV induction Ventilation: Mask ventilation without difficulty LMA: LMA inserted LMA Size: 4.0 Number of attempts: 1 Placement Confirmation: breath sounds checked- equal and bilateral,  CO2 detector,  positive ETCO2 and ETT inserted through vocal cords under direct vision Tube secured with: Tape Dental Injury: Teeth and Oropharynx as per pre-operative assessment

## 2019-10-19 NOTE — Op Note (Signed)
Operative Note  10/19/2019  8:22 AM  PATIENT:  Cassidy [name]  34 y.o. female  PRE-OPERATIVE DIAGNOSIS:  Menometrorrhagia with two intrauterine lesions consistent with polyps  POST-OPERATIVE DIAGNOSIS:  Menometrorrhagia with two intrauterine lesions consistent with polyps, THICKENED ENDOMETRIUM, VAGINAL ENDOMETRIOSIS  PROCEDURE:  Procedure(s): HYSTEROSCOPY WITH MYOSURE EXCISION OF POLYPS, DILATATION & CURETTAGE/ VAGINAL ABLATION OF ENDOMETRIOSIS  SURGEON:  Surgeon(s): Genia Del, MD  ANESTHESIA:   general  FINDINGS: 2 Endometrial Polyps and Thickened Endometrium.  Lesion of Endometriosis at the right posterior vaginal fornix.  DESCRIPTION OF OPERATION: Under general anesthesia with laryngeal mask, the patient is in lithotomy position she is prepped with Betadine on the supra pubic, vulvar and vaginal areas.  The bladder is catheterized.  The patient is draped as usual.  Timeout is done.  The vaginal exam reveals an anteverted uterus, normal volume, mobile.  No adnexal mass felt.  The speculum is inserted in the vagina and the anterior lip of the cervix is grasped with a tenaculum.  A paracervical block is done with lidocaine 1% a total of 20 cc at 4 and 8:00.  We note a typical blue lesion of endometriosis at the right posterior fornix of the vagina.  This lesion is cauterized with the Bovie and typical chocolate material evacuates at that time.  We proceeded with dilation of the cervix with Pratt dilators up to #19 without difficulty.  The hysteroscope was inserted in the intra uterine cavity.  Inspection of the intra uterine cavity reveals 2 normal ostia, 2 endometrial polyps measuring about 1.5 cm are present at the right posterior wall and the left anterior wall of the cavity.  Areas of endometrial thickening are also present especially at the lower uterine segment.  Pictures are taken.  The reach MyoSure is used to excise all those areas.  Pictures are taken after excisions.   Hemostasis is adequate.  The hysteroscope with the MyoSure are removed.  We proceed with a systematic curettage of the intrauterine cavity on all surfaces.  Both specimens are sent together to pathology.  All instruments are removed.  The patient is brought to recovery room in good and stable status.  ESTIMATED BLOOD LOSS: 5 mL   Intake/Output Summary (Last 24 hours) at 10/19/2019 8841 Last data filed at 10/19/2019 6606 Gross per 24 hour  Intake 900 ml  Output 125 ml  Net 775 ml     BLOOD ADMINISTERED:none   LOCAL MEDICATIONS USED:  Lidocaine 1% 20 cc for Paracervical Block  SPECIMEN:  Source of Specimen:  Endometrial Polyps, Endometrial curettings.  DISPOSITION OF SPECIMEN:  PATHOLOGY  COUNTS:  YES  PLAN OF CARE: Transfer to PACU  Marie-Lyne LavoieMD8:22 AM

## 2019-10-19 NOTE — H&P (Signed)
Cassidy [name] is an 34 y.o. female [phone]  S/P TL.  RP: Menometrorrhagia/Endometrial Polyp for HSC/Myosure Excision/D+C  HPI: Patient has had heavy menses for a long time but recently she is having more irregular vaginal bleeding including frequent metrorrhagia.  No pelvic pain except for cramping when bleeding.  No pain with intercourse.  Status post tubal ligation.  IU lesion per Sonohysto.   Menstrual History:  Patient's last menstrual period was 10/17/2019.    Past Medical History:  Diagnosis Date  . 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 HSV infection   . Hypertension   . Hypothyroidism   . Migraine   . Pneumonia 2013  . Pregnancy induced hypertension   . UTI (urinary tract infection)   . Vaginal Pap smear, abnormal     Past Surgical History:  Procedure Laterality Date  . BREAST BIOPSY    . CESAREAN SECTION  last 2018   x2  . COLPOSCOPY  2005  . thyroid ablation  2017    Family History  Problem Relation Age of Onset  . Hypertension Father   . Hypothyroidism Father   . Hypertension Mother   . Diabetes Mother   . Hypothyroidism Mother     Social History:  reports that she has never smoked. She has never used smokeless tobacco. She reports that she does not drink alcohol or use drugs.  Allergies:  Allergies  Allergen Reactions  . Latex Rash    swellling when touches    Medications Prior to Admission  Medication Sig Dispense Refill Last Dose  . amLODipine (NORVASC) 5 MG tablet Take 1 tablet (5 mg total) by mouth daily. (Patient taking differently: Take 5 mg by mouth at bedtime. ) 90 tablet 3 10/18/2019 at Unknown time  . levothyroxine (SYNTHROID) 50 MCG tablet Take 1 tablet (50 mcg total) by mouth daily before breakfast. (Patient taking differently: Take 50 mcg by mouth at bedtime. ) 90 tablet 3 10/18/2019 at Unknown time  . butalbital-acetaminophen-caffeine (FIORICET)  50-325-40 MG tablet Take 1-2 tablets by mouth every 6 (six) hours as needed for headache. 20 tablet 0 More than a month at Unknown time    REVIEW OF SYSTEMS: A ROS was performed and pertinent positives and negatives are included in the history.  GENERAL: No fevers or chills. HEENT: No change in vision, no earache, sore throat or sinus congestion. NECK: No pain or stiffness. CARDIOVASCULAR: No chest pain or pressure. No palpitations. PULMONARY: No shortness of breath, cough or wheeze. GASTROINTESTINAL: No abdominal pain, nausea, vomiting or diarrhea, melena or bright red blood per rectum. GENITOURINARY: No urinary frequency, urgency, hesitancy or dysuria. MUSCULOSKELETAL: No joint or muscle pain, no back pain, no recent trauma. DERMATOLOGIC: No rash, no itching, no lesions. ENDOCRINE: No polyuria, polydipsia, no heat or cold intolerance. No recent change in weight. HEMATOLOGICAL: No anemia or easy bruising or bleeding. NEUROLOGIC: No headache, seizures, numbness, tingling or weakness. PSYCHIATRIC: No depression, no loss of interest in normal activity or change in sleep pattern.     Blood pressure 133/82, pulse 89, temperature 98 F (36.7 C), temperature source Oral, resp. rate 16, height 5\' 1"  (1.549 m), weight 82.9 kg, last menstrual period 10/17/2019, SpO2 100 %, currently breastfeeding.  Physical Exam:  See office notes   Results for orders placed or performed during the hospital encounter of 10/19/19 (from the past 24 hour(s))  Pregnancy, urine POC  Status: None   Collection Time: 10/19/19  5:49 AM  Result Value Ref Range   Preg Test, Ur NEGATIVE NEGATIVE  CBC     Status: Abnormal   Collection Time: 10/19/19  6:18 AM  Result Value Ref Range   WBC 6.7 4.0 - 10.5 K/uL   RBC 4.00 3.87 - 5.11 MIL/uL   Hemoglobin 8.8 (L) 12.0 - 15.0 g/dL   HCT 45.8 (L) 09.9 - 83.3 %   MCV 75.8 (L) 80.0 - 100.0 fL   MCH 22.0 (L) 26.0 - 34.0 pg   MCHC 29.0 (L) 30.0 - 36.0 g/dL   RDW 82.5 (H) 05.3 - 97.6  %   Platelets 392 150 - 400 K/uL   nRBC 0.0 0.0 - 0.2 %  I-STAT, chem 8     Status: Abnormal   Collection Time: 10/19/19  6:25 AM  Result Value Ref Range   Sodium 141 135 - 145 mmol/L   Potassium 3.4 (L) 3.5 - 5.1 mmol/L   Chloride 108 98 - 111 mmol/L   BUN 11 6 - 20 mg/dL   Creatinine, Ser 7.34 0.44 - 1.00 mg/dL   Glucose, Bld 94 70 - 99 mg/dL   Calcium, Ion 1.93 7.90 - 1.40 mmol/L   TCO2 22 22 - 32 mmol/L   Hemoglobin 10.5 (L) 12.0 - 15.0 g/dL   HCT 24.0 (L) 97.3 - 53.2 %     Sono Infusion Hysterogram ( procedure note)   The initial transvaginal ultrasound demonstrated the following: T/V images.  Latex precautions used.  Anteverted uterus, retroflexed at the C-section scar.  No myometrial mass.  Uterine size measured at 7.72 x 3.86 x 3.67 cm.  Irregularly thickened endometrial lining measured at 15 mm.  Feeder vessel identified to area of thickening with 3D imaging suggestive of at least 2 endometrial masses, 9 mm each.  Both ovaries normal in size and appearance with a follicular pattern.  No adnexal mass seen.  No free fluid in the posterior cul-de-sac.  The speculum  was inserted and the cervix cleansed with Betadine solution after confirming that patient has no allergies.A small sonohysterography catheterwas utilized.  Insertion was facilitated with ring forceps, using a spear-like motion the catheter was inserted to the fundus of the uterus. The speculum is then removed carefully to avoid dislodging the catheter. The catheter was flushed with sterile saline delete prior to insertion to rid it of small amounts of air.the sterile saline solution was infused into the uterine cavity as a vaginal ultrasound probe was then placed in the vagina for full visualization of the uterine cavity from a transvaginal approach. The following was noted: Saline infusion confirming 2 intracavitary masses measuring 1.3 x 0.8 cm and 1.3 x 0.4 cm.  Otherwise smooth thin cavity walls measured at 4.0  mm.  The catheter was then removed after retrieving some of the saline from the intrauterine cavity. An endometrial biopsy was not done. Patient tolerated procedure well. She had received a tablet of Aleve for discomfort.   Covid Negative   Assessment/Plan:  35 y.o. [id number]   1. Menorrhagia with irregular cycle Menorrhagia with irregular cycle including metrorrhagia.  Pelvic ultrasound findings as well as sonohysterogram findings reviewed with patient.  2 intra uterine masses seen on sonohysterogram compatible with endometrial polyps.  2. Thickened endometrium As above, sonohysterogram showing probable endometrial polyps x2  3. Endometrial polyp 2 endometrial polyps, both measured at 1.3 cm.  Findings reviewed and surgical removal recommended. HSC/Myosure Excision of Polyps/D+C.  Information and  pamphlet given.   4. S/P tubal ligation                        Patient was counseled as to the risk of surgery to include the following:  1. Infection (prohylactic antibiotics will be administered)  2. DVT/Pulmonary Embolism (prophylactic pneumo compression stockings will be used)  3.Trauma to internal organs requiring additional surgical procedure to repair any injury to internal organs requiring perhaps additional hospitalization days.  4.Hemmorhage requiring transfusion and blood products which carry risks such as   anaphylactic reaction, hepatitis and AIDS  Patient had received literature information on the procedure scheduled and all her questions were answered and fully accepts all risk.   Marie-Lyne Humaira Sculley 10/19/2019, 7:20 AM

## 2019-10-19 NOTE — Transfer of Care (Signed)
Immediate Anesthesia Transfer of Care Note  Patient: Cassidy [name]  Procedure(s) Performed: DILATATION & CURETTAGE/HYSTEROSCOPY WITH MYOSURE VAGINAL ABLATION OF ENDOMETRIOSIS (N/A )  Patient Location: PACU  Anesthesia Type:General  Level of Consciousness: awake, alert , oriented and patient cooperative  Airway & Oxygen Therapy: Patient Spontanous Breathing and Patient connected to nasal cannula oxygen  Post-op Assessment: Report given to RN and Post -op Vital signs reviewed and stable  Post vital signs: Reviewed and stable  Last Vitals:  Vitals Value Taken Time  BP    Temp    Pulse    Resp    SpO2      Last Pain:  Vitals:   10/19/19 0603  TempSrc: Oral  PainSc: 0-No pain      Patients Stated Pain Goal: 5 (10/19/19 0603)  Complications: No apparent anesthesia complications

## 2019-10-19 NOTE — Discharge Instructions (Addendum)
Hysteroscopy, Care After This sheet gives you information about how to care for yourself after your procedure. Your health care provider may also give you more specific instructions. If you have problems or questions, contact your health care provider. What can I expect after the procedure? After the procedure, it is common to have:  Cramping.  Bleeding. This can vary from light spotting to menstrual-like bleeding. Follow these instructions at home: Activity  Rest for 1-2 days after the procedure.  Do not douche, use tampons, or have sex for 2 weeks after the procedure, or until your health care provider approves.  Do not drive for 24 hours after the procedure, or for as long as told by your health care provider.  Do not drive, use heavy machinery, or drink alcohol while taking prescription pain medicines. Medicines   Take over-the-counter and prescription medicines only as told by your health care provider.  Do not take aspirin during recovery. It can increase the risk of bleeding. General instructions  Do not take baths, swim, or use a hot tub until your health care provider approves. Take showers instead of baths for 2 weeks, or for as long as told by your health care provider.  To prevent or treat constipation while you are taking prescription pain medicine, your health care provider may recommend that you: ? Drink enough fluid to keep your urine clear or pale yellow. ? Take over-the-counter or prescription medicines. ? Eat foods that are high in fiber, such as fresh fruits and vegetables, whole grains, and beans. ? Limit foods that are high in fat and processed sugars, such as fried and sweet foods.  Keep all follow-up visits as told by your health care provider. This is important. Contact a health care provider if:  You feel dizzy or lightheaded.  You feel nauseous.  You have abnormal vaginal discharge.  You have a rash.  You have pain that does not get better with  medicine.  You have chills. Get help right away if:  You have bleeding that is heavier than a normal menstrual period.  You have a fever.  You have pain or cramps that get worse.  You develop new abdominal pain.  You faint.  You have pain in your shoulders.  You have shortness of breath. Summary  After the procedure, you may have cramping and some vaginal bleeding.  Do not douche, use tampons, or have sex for 2 weeks after the procedure, or until your health care provider approves.  Do not take baths, swim, or use a hot tub until your health care provider approves. Take showers instead of baths for 2 weeks, or for as long as told by your health care provider.  Report any unusual symptoms to your health care provider.  Keep all follow-up visits as told by your health care provider. This is important. This information is not intended to replace advice given to you by your health care provider. Make sure you discuss any questions you have with your health care provider. Document Revised: 09/02/2017 Document Reviewed: 10/19/2016 Elsevier Patient Education  2020 [employer].    Post Anesthesia Home Care Instructions  Activity: Get plenty of rest for the remainder of the day. A responsible individual must stay with you for 24 hours following the procedure.  For the next 24 hours, DO NOT: -Drive a car -Advertising copywriter -Drink alcoholic beverages -Take any medication unless instructed by your physician -Make any legal decisions or sign important papers.  Meals: Start with liquid  foods such as gelatin or soup. Progress to regular foods as tolerated. Avoid greasy, spicy, heavy foods. If nausea and/or vomiting occur, drink only clear liquids until the nausea and/or vomiting subsides. Call your physician if vomiting continues.  Special Instructions/Symptoms: Your throat may feel dry or sore from the anesthesia or the breathing tube placed in your throat during surgery. If  this causes discomfort, gargle with warm salt water. The discomfort should disappear within 24 hours.  May take Ibuprofen, Advil, Motrin, or Aleve after 2 PM.

## 2019-10-19 NOTE — Anesthesia Postprocedure Evaluation (Signed)
Anesthesia Post Note  Patient: Cassidy [name]  Procedure(s) Performed: DILATATION & CURETTAGE/HYSTEROSCOPY WITH MYOSURE VAGINAL ABLATION OF ENDOMETRIOSIS (N/A )     Patient location during evaluation: PACU Anesthesia Type: General Level of consciousness: awake and alert Pain management: pain level controlled Vital Signs Assessment: post-procedure vital signs reviewed and stable Respiratory status: spontaneous breathing, nonlabored ventilation and respiratory function stable Cardiovascular status: blood pressure returned to baseline and stable Postop Assessment: no apparent nausea or vomiting Anesthetic complications: no    Last Vitals:  Vitals:   10/19/19 0815 10/19/19 0830  BP: 119/81 120/80  Pulse: 88 78  Resp: 14 19  Temp:    SpO2: 100% 100%    Last Pain:  Vitals:   10/19/19 0813  TempSrc:   PainSc: 0-No pain                 Beryle Lathe

## 2019-10-22 LAB — SURGICAL PATHOLOGY

## 2019-11-20 ENCOUNTER — Ambulatory Visit: Payer: BC Managed Care – PPO | Admitting: Obstetrics & Gynecology

## 2019-11-23 ENCOUNTER — Other Ambulatory Visit: Payer: Self-pay

## 2019-11-23 ENCOUNTER — Encounter: Payer: Self-pay | Admitting: Obstetrics & Gynecology

## 2019-11-23 ENCOUNTER — Ambulatory Visit: Payer: BC Managed Care – PPO | Admitting: Obstetrics & Gynecology

## 2019-11-23 VITALS — BP 118/80

## 2019-11-23 DIAGNOSIS — Z09 Encounter for follow-up examination after completed treatment for conditions other than malignant neoplasm: Secondary | ICD-10-CM

## 2019-11-23 DIAGNOSIS — N898 Other specified noninflammatory disorders of vagina: Secondary | ICD-10-CM | POA: Diagnosis not present

## 2019-11-23 LAB — WET PREP FOR TRICH, YEAST, CLUE

## 2019-11-23 MED ORDER — TERCONAZOLE 0.8 % VA CREA: 1.0000 | TOPICAL_CREAM | Freq: Every day | VAGINAL | 2 refills | Status: AC

## 2019-11-23 NOTE — Patient Instructions (Signed)
1. Status post gynecological surgery, follow-up exam Good postop healing after hysteroscopy/MyoSure excision/D&C/cauterization of vaginal endometriosis.  Pathology benign polyp and secretory endometrium.  Patient reassured.  2. Vaginal itching Wet prep negative, but on her menstrual period.  Symptoms compatible with yeast vaginitis.  Will treat with terconazole 0.8% vaginal cream.  Usage reviewed and prescription sent to pharmacy. - WET PREP FOR TRICH, YEAST, CLUE  Other orders - terconazole (TERAZOL 3) 0.8 % vaginal cream; Place 1 applicator vaginally at bedtime for 3 days.  Cassidy [name], it was a pleasure seeing you today!

## 2019-11-23 NOTE — Progress Notes (Signed)
    Cassidy [name] 16-Feb-1986 [id number]        34 y.o.  [id number]   RP: Postop HSC/Myosure excision/D+C  HPI: Good postop healing.  No pelvic pain.  Mild vaginal bleeding postop stopped.  Now on her period, normal flow.  C/O vaginal itching.  No fever.  Urine/BMs normal.   OB History  Gravida Para Term Preterm AB Living  3 1   1 1 2   SAB TAB Ectopic Multiple Live Births  1       1    # Outcome Date GA Lbr Len/2nd Weight Sex Delivery Anes PTL Lv  3 Preterm 04/30/14 [redacted]w[redacted]d    CS-LTranv        Complications: Pre-eclampsia  2 Gravida           1 SAB             Past medical history,surgical history, problem list, medications, allergies, family history and social history were all reviewed and documented in the EPIC chart.   Directed ROS with pertinent positives and negatives documented in the history of present illness/assessment and plan.  Exam:  Vitals:   11/23/19 1419  BP: 118/80   General appearance:  Normal  Abdomen: Normal  Gynecologic exam: Vulva normal.  Speculum:  Cervix/Vagina normal.  Mild dark menstrual blood present.  Wet prep done.  Bimanual exam:  Uterus AV/normal size/mobile/NT.  No adnexal mass/NT.  Wet prep: Negative  Patho 10/19/2019: FINAL MICROSCOPIC DIAGNOSIS:   A. ENDOMETRIUM, CURETTAGE AND POLYP:  - Benign endometrial polyp  - Benign nonpolypoid secretory endometrium  - Negative for hyperplasia or malignancy    Assessment/Plan:  34 y.o. 31   1. Status post gynecological surgery, follow-up exam Good postop healing after hysteroscopy/MyoSure excision/D&C/cauterization of vaginal endometriosis.  Pathology benign polyp and secretory endometrium.  Patient reassured.  2. Vaginal itching Wet prep negative, but on her menstrual period.  Symptoms compatible with yeast vaginitis.  Will treat with terconazole 0.8% vaginal cream.  Usage reviewed and prescription sent to pharmacy. - WET PREP FOR TRICH, YEAST, CLUE  Other orders - terconazole  (TERAZOL 3) 0.8 % vaginal cream; Place 1 applicator vaginally at bedtime for 3 days.  [id number] MD, 2:30 PM 11/23/2019

## 2020-01-09 ENCOUNTER — Ambulatory Visit: Payer: BC Managed Care – PPO | Admitting: Endocrinology

## 2020-01-11 ENCOUNTER — Other Ambulatory Visit: Payer: Self-pay

## 2020-01-15 ENCOUNTER — Ambulatory Visit: Payer: BC Managed Care – PPO | Admitting: Endocrinology

## 2020-01-16 ENCOUNTER — Encounter: Payer: Self-pay | Admitting: Family Medicine

## 2020-01-16 ENCOUNTER — Telehealth (INDEPENDENT_AMBULATORY_CARE_PROVIDER_SITE_OTHER): Payer: BC Managed Care – PPO | Admitting: Family Medicine

## 2020-01-16 ENCOUNTER — Telehealth: Payer: Self-pay | Admitting: Family Medicine

## 2020-01-16 DIAGNOSIS — S161XXA Strain of muscle, fascia and tendon at neck level, initial encounter: Secondary | ICD-10-CM

## 2020-01-16 DIAGNOSIS — Z7189 Other specified counseling: Secondary | ICD-10-CM

## 2020-01-16 DIAGNOSIS — R519 Headache, unspecified: Secondary | ICD-10-CM

## 2020-01-16 DIAGNOSIS — R42 Dizziness and giddiness: Secondary | ICD-10-CM | POA: Diagnosis not present

## 2020-01-16 MED ORDER — CYCLOBENZAPRINE HCL 5 MG PO TABS: 5.0000 mg | ORAL_TABLET | Freq: Three times a day (TID) | ORAL | 0 refills | Status: DC | PRN

## 2020-01-16 NOTE — Telephone Encounter (Signed)
Pt is scheduled for MyChart Video visit with Dr Salomon Fick this afternoon

## 2020-01-16 NOTE — Telephone Encounter (Signed)
01/11/2020 pt has been experiencing really bad headaches (lightheadness and dizziness)  for the past few days some days worse than others and this is a symptom that is part of the [employer] vaccine.  She experienced fever and chills later that afternoon after taking the vaccine that day.  She had been experiencing fatigue for about a week but is has not been everyday.  Pt would like to have a call to let her know what she needs to do from here.

## 2020-01-16 NOTE — Progress Notes (Signed)
Virtual Visit via Video Note  I connected with Cassidy [name] on 01/16/20 at  2:30 PM EDT by a video enabled telemedicine application 2/2 COVID-19 pandemic and verified that I am speaking with the correct person using two identifiers.  Location patient: home Location provider:work or home office Persons participating in the virtual visit: patient, provider  I discussed the limitations of evaluation and management by telemedicine and the availability of in person appointments. The patient expressed understanding and agreed to proceed.   HPI: Pt is a 34 yo female with pmh sig for Hypothyroidism,  and headaches seen for acute concern.  Pt with HA, neck stiffness, lightheadedness that started last wk.  Pt has tension in R side of neck making it difficult to turn head to the R.  Still having neck stiffness after a 90 min massage over the weekend.  Pt notes intermittent dizziness.  Increasing po intake of water and fluids.  Notes HAs in posterior head at occipital area.  BP has been normal when checked.   LMP last month.  Pt s/p b/l tubal ligation.  Pt has a 36  Yo and 36 yo who is not sleeping through the night.  Pt was concerned that her symptoms were 2/2 the J & J COVID vaccine she had on 12/12/19.  In March after receiving the vaccine pt had chills, fever, fatigue that have since resolved.   ROS: See pertinent positives and negatives per HPI.  Past Medical History:  Diagnosis Date  . 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 HSV infection   . Hypertension   . Hypothyroidism   . Migraine   . Pneumonia 2013  . Pregnancy induced hypertension   . UTI (urinary tract infection)   . Vaginal Pap smear, abnormal     Past Surgical History:  Procedure Laterality Date  . BREAST BIOPSY    . CESAREAN SECTION  last 2018   x2  . COLPOSCOPY  2005  . DILATATION & CURETTAGE/HYSTEROSCOPY WITH MYOSURE N/A 10/19/2019   Procedure:  DILATATION & CURETTAGE/HYSTEROSCOPY WITH MYOSURE VAGINAL ABLATION OF ENDOMETRIOSIS;  Surgeon: Genia Del, MD;  Location: [facility];  Service: Gynecology;  Laterality: N/A;  request 7:30am OR time in [place] Gyn block requests 30 minutes OR time  . thyroid ablation  2017    Family History  Problem Relation Age of Onset  . Hypertension Father   . Hypothyroidism Father   . Hypertension Mother   . Diabetes Mother   . Hypothyroidism Mother        Current Outpatient Medications:  .  amLODipine (NORVASC) 5 MG tablet, Take 1 tablet (5 mg total) by mouth daily. (Patient taking differently: Take 5 mg by mouth at bedtime. ), Disp: 90 tablet, Rfl: 3 .  butalbital-acetaminophen-caffeine (FIORICET) 50-325-40 MG tablet, Take 1-2 tablets by mouth every 6 (six) hours as needed for headache., Disp: 20 tablet, Rfl: 0 .  levothyroxine (SYNTHROID) 50 MCG tablet, Take 1 tablet (50 mcg total) by mouth daily before breakfast. (Patient taking differently: Take 50 mcg by mouth at bedtime. ), Disp: 90 tablet, Rfl: 3  EXAM:  VITALS per patient if applicable: RR between 12-20 bpm  GENERAL: alert, oriented, appears well and in no acute distress.  HEENT: atraumatic, conjunctiva clear, no obvious abnormalities on inspection of external nose and ears  NECK: normal movements of the head and neck.  Appears comfortable with flexion  and extension of neck without pain  LUNGS: on inspection no signs of respiratory distress, breathing rate appears normal, no obvious gross SOB, gasping or wheezing  CV: no obvious cyanosis  MS: moves all visible extremities without noticeable abnormality  PSYCH/NEURO: pleasant and cooperative, no obvious depression or anxiety, speech and thought processing grossly intact  ASSESSMENT AND PLAN:  Discussed the following assessment and plan:  Acute nonintractable headache, unspecified headache type -Discussed various causes.  J&J Covid vaccine less likely  given time between receiving vaccine and start of symptoms. -Continue supportive care -Use Fioricet sparingly as can cause rebound headaches with frequent use. -Continue to monitor  Strain of cervical portion of right trapezius muscle -Discussed various causes including poor posture, decreased support from pillow, stress. -Red flag warning signs absent.  Less concern for meningitis. -Consider ergonomic changes with desk at work to avoid having to lean forward -Continue supportive care including heat or ice, massage, NSAIDs. -Given precautions - Plan: cyclobenzaprine (FLEXERIL) 5 MG tablet  Lightheadedness -Discussed various causes including dehydration, vertigo, elevated BP-less likely as BP has been controlled when checked, medications/supplements-though is not on any new meds, BPV, pregnancy-s/p b/l tubal ligation--LMP last month -Continue increasing p.o. hydration -Advised to take time when moving from sitting to standing position -Consider urine hCG if menses absent this month. -We will continue to monitor  Educated about COVID-19 virus infection -Discussed symptoms less likely 2/2 J&J Covid vaccine as vaccine was given 1 month ago. -Questions answered  Follow-up as needed in 2 to 3 days for continued symptoms   I discussed the assessment and treatment plan with the patient. The patient was provided an opportunity to ask questions and all were answered. The patient agreed with the plan and demonstrated an understanding of the instructions.   The patient was advised to call back or seek an in-person evaluation if the symptoms worsen or if the condition fails to improve as anticipated.   Billie Ruddy, MD

## 2020-03-20 ENCOUNTER — Other Ambulatory Visit: Payer: Self-pay | Admitting: Family Medicine

## 2020-03-20 DIAGNOSIS — I10 Essential (primary) hypertension: Secondary | ICD-10-CM

## 2020-06-16 ENCOUNTER — Telehealth: Payer: Self-pay | Admitting: *Deleted

## 2020-06-16 NOTE — Telephone Encounter (Signed)
Patient called after hours line on 06/13/2020. Patient reports she she tested positive for covid to 06/13/2020. No taste or smell, little congestion no runny nose, no cough or fever. symptoms started 5 days ago. she wants to know if she needs a Prescription. Patient is fully vaccinated.   Please advise

## 2020-07-13 ENCOUNTER — Other Ambulatory Visit: Payer: Self-pay | Admitting: Endocrinology

## 2020-07-13 DIAGNOSIS — E039 Hypothyroidism, unspecified: Secondary | ICD-10-CM

## 2020-07-13 NOTE — Telephone Encounter (Signed)
1.  Please schedule f/u appt 2.  Then please refill x 2 mos, pending that appt.  

## 2020-10-11 ENCOUNTER — Other Ambulatory Visit: Payer: Self-pay | Admitting: Endocrinology

## 2020-10-11 DIAGNOSIS — E039 Hypothyroidism, unspecified: Secondary | ICD-10-CM

## 2020-10-11 NOTE — Telephone Encounter (Signed)
1.  Please schedule f/u appt 2.  Then please refill x 2 mos, pending that appt.  

## 2021-03-12 ENCOUNTER — Institutional Professional Consult (permissible substitution): Payer: BC Managed Care – PPO | Admitting: Internal Medicine

## 2021-03-16 ENCOUNTER — Other Ambulatory Visit (HOSPITAL_COMMUNITY)
Admission: RE | Admit: 2021-03-16 | Discharge: 2021-03-16 | Disposition: A | Discharge status: Home / Self Care | Payer: BC Managed Care – PPO | Source: Ambulatory Visit | Attending: Obstetrics & Gynecology | Admitting: Obstetrics & Gynecology

## 2021-03-16 ENCOUNTER — Encounter: Payer: Self-pay | Admitting: Obstetrics & Gynecology

## 2021-03-16 ENCOUNTER — Other Ambulatory Visit: Payer: Self-pay

## 2021-03-16 ENCOUNTER — Ambulatory Visit (INDEPENDENT_AMBULATORY_CARE_PROVIDER_SITE_OTHER): Payer: BC Managed Care – PPO | Admitting: Obstetrics & Gynecology

## 2021-03-16 VITALS — BP 136/80 | Ht 61.0 in | Wt 178.0 lb

## 2021-03-16 DIAGNOSIS — Z9851 Tubal ligation status: Secondary | ICD-10-CM

## 2021-03-16 DIAGNOSIS — Z01419 Encounter for gynecological examination (general) (routine) without abnormal findings: Secondary | ICD-10-CM | POA: Insufficient documentation

## 2021-03-16 DIAGNOSIS — E6609 Other obesity due to excess calories: Secondary | ICD-10-CM

## 2021-03-16 DIAGNOSIS — N945 Secondary dysmenorrhea: Secondary | ICD-10-CM | POA: Diagnosis not present

## 2021-03-16 DIAGNOSIS — Z6833 Body mass index (BMI) 33.0-33.9, adult: Secondary | ICD-10-CM

## 2021-03-16 MED ORDER — NORETHINDRONE 0.35 MG PO TABS: 1.0000 | ORAL_TABLET | Freq: Every day | ORAL | 4 refills | Status: DC

## 2021-03-16 NOTE — Progress Notes (Signed)
Cassidy [name] 1986-07-04 [id number]   History:    35 y.o. [id number] Married    RP:  Established patient presenting for annual gyn exam   HPI: Had a HSC/Myosure excision of Polyp/D+C in 10/2019 with me.  S/P Tubal ligation. Monthly cycles with moderate to heavy bleeding, but increased dysmenorrhea.   No pelvic pain.  No pain with IC.  Breasts normal.  BMI 33.63.  Health labs with Fam MD.  cHTN on Meds.  Gardasil series completed.     Past medical history,surgical history, family history and social history were all reviewed and documented in the EPIC chart.  Gynecologic History No LMP recorded.  Obstetric History OB History  Gravida Para Term Preterm AB Living  3 1   1 1 2   SAB IAB Ectopic Multiple Live Births  1       1    # Outcome Date GA Lbr Len/2nd Weight Sex Delivery Anes PTL Lv  3 Preterm 04/30/14 [redacted]w[redacted]d    CS-LTranv        Complications: Pre-eclampsia  2 Gravida           1 SAB              ROS: A ROS was performed and pertinent positives and negatives are included in the history.  GENERAL: No fevers or chills. HEENT: No change in vision, no earache, sore throat or sinus congestion. NECK: No pain or stiffness. CARDIOVASCULAR: No chest pain or pressure. No palpitations. PULMONARY: No shortness of breath, cough or wheeze. GASTROINTESTINAL: No abdominal pain, nausea, vomiting or diarrhea, melena or bright red blood per rectum. GENITOURINARY: No urinary frequency, urgency, hesitancy or dysuria. MUSCULOSKELETAL: No joint or muscle pain, no back pain, no recent trauma. DERMATOLOGIC: No rash, no itching, no lesions. ENDOCRINE: No polyuria, polydipsia, no heat or cold intolerance. No recent change in weight. HEMATOLOGICAL: No anemia or easy bruising or bleeding. NEUROLOGIC: No headache, seizures, numbness, tingling or weakness. PSYCHIATRIC: No depression, no loss of interest in normal activity or change in sleep pattern.     Exam:   BP 136/80   Ht 5\' 1"  (1.549 m)    Wt 178 lb (80.7 kg)   BMI 33.63 kg/m   Body mass index is 33.63 kg/m.  General appearance : Well developed well nourished female. No acute distress HEENT: Eyes: no retinal hemorrhage or exudates,  Neck supple, trachea midline, no carotid bruits, no thyroidmegaly Lungs: Clear to auscultation, no rhonchi or wheezes, or rib retractions  Heart: Regular rate and rhythm, no murmurs or gallops Breast:Examined in sitting and supine position were symmetrical in appearance, no palpable masses or tenderness,  no skin retraction, no nipple inversion, no nipple discharge, no skin discoloration, no axillary or supraclavicular lymphadenopathy Abdomen: no palpable masses or tenderness, no rebound or guarding Extremities: no edema or skin discoloration or tenderness  Pelvic: Vulva: Normal             Vagina: No gross lesions or discharge  Cervix: No gross lesions or discharge.  Pap reflex done.  Uterus  AV, normal size, shape and consistency, non-tender and mobile  Adnexa  Without masses or tenderness  Anus: Normal   Assessment/Plan:  35 y.o. female for annual exam   1. Encounter for routine gynecological examination with Papanicolaou smear of cervix Normal gynecologic exam.  Pap reflex done.  Breast exam normal.  Health labs with family physician.  On Norvasc for chronic hypertension.  2. S/P tubal ligation  3. Secondary dysmenorrhea Increased flow with regular menstrual periods and more severe dysmenorrhea.  Recommend regular ibuprofen during the menstrual.  Ibuprofen 800 mg per mouth every 8 hours.  Decision to control the cycle with norethindrone 0.35 1 tablet daily.  No contraindication.  Usage reviewed.  Prescription sent to pharmacy.  4. Class 1 obesity due to excess calories with serious comorbidity and body mass index (BMI) of 33.0 to 33.9 in adult Recommend a lower calorie/carb diet.  Aerobic activities 5 times a week and light weightlifting every 2 days.  Other orders - norethindrone  (MICRONOR) 0.35 MG tablet; Take 1 tablet (0.35 mg total) by mouth daily.   Genia Del MD, 8:22 AM 03/16/2021

## 2021-03-18 LAB — CYTOLOGY - PAP
Comment: NEGATIVE
Diagnosis: NEGATIVE
High risk HPV: NEGATIVE

## 2021-03-18 NOTE — Progress Notes (Signed)
03/19/21- 34 yoF never smoker for sleep evaluation  Medical problem list includes Migraine, HTN, hx Pneumonia, Hypothyroid,  Epworth score-12 Body weight today-178 lbs Covid vax- J&J, 1 Phizer -----Snoring, loud breathing, and gasping witnessed by husband. Started while pregnant 4 years ago and has gotten worse since then.  Tired. No sleep meds,little caffeine. No ENT surgery. Has septal deviation.. Hypothyroid- managed.  Both parents on CPAP.   Prior to Admission medications   Medication Sig Start Date End Date Taking? Authorizing Provider  amLODipine (NORVASC) 5 MG tablet TAKE 1 TABLET(5 MG) BY MOUTH DAILY 03/21/20  Yes Deeann Saint, MD  levothyroxine (SYNTHROID) 50 MCG tablet TAKE 1 TABLET(50 MCG) BY MOUTH DAILY BEFORE BREAKFAST 07/14/20  Yes Romero Belling, MD  cyclobenzaprine (FLEXERIL) 5 MG tablet Take 1 tablet (5 mg total) by mouth 3 (three) times daily as needed for muscle spasms. Patient not taking: Reported on 03/19/2021 01/16/20   Deeann Saint, MD  norethindrone (MICRONOR) 0.35 MG tablet Take 1 tablet (0.35 mg total) by mouth daily. 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   POS HIGH RISK HPV   HSV infection    Hypertension    Hypothyroidism    Migraine    Pneumonia 2013   Pregnancy induced hypertension    UTI (urinary tract infection)    Vaginal Pap smear, abnormal    Past Surgical History:  Procedure Laterality Date   BREAST BIOPSY     CESAREAN SECTION  last 2018   x2   COLPOSCOPY  2005   DILATATION & CURETTAGE/HYSTEROSCOPY WITH MYOSURE N/A 10/19/2019   Procedure: DILATATION & CURETTAGE/HYSTEROSCOPY WITH MYOSURE VAGINAL ABLATION OF ENDOMETRIOSIS;  Surgeon: Genia Del, MD;  Location: [facility];  Service: Gynecology;  Laterality: N/A;  request 7:30am OR time in [place]  Gyn block requests 30 minutes OR time   thyroid ablation  2017   Family History  Problem Relation Age of Onset   Hypertension Father    Hypothyroidism Father    Hypertension Mother    Diabetes Mother    Hypothyroidism Mother    Social History   Socioeconomic History   Marital status: Married    Spouse name: Not on file   Number of children: 2   Years of education: Not on file   Highest education level: Not on file  Occupational History   Not on file  Tobacco Use   Smoking status: Never   Smokeless tobacco: Never  Vaping Use   Vaping Use: Never used  Substance and Sexual Activity   Alcohol use: No   Drug use: No   Sexual activity: Yes    Birth control/protection: None, Surgical    Comment: declined insurancde questions  Other Topics Concern   Not on file  Social History Narrative   Lives with husband and 2 kids.    Social Determinants of Health   Financial Resource Strain: Not on file  Food Insecurity: Not on file  Transportation Needs: Not on file  Physical Activity: Not on file  Stress: Not on file  Social Connections: Not on file  Intimate Partner Violence: Not on file   ROS-see HPI   + = positive Constitutional:    weight loss, night sweats, fevers, chills, fatigue, lassitude. HEENT:    headaches, difficulty swallowing, tooth/dental problems, sore throat,  sneezing, itching, ear ache, nasal congestion, post nasal drip, snoring CV:    chest pain, orthopnea, PND, swelling in lower extremities, anasarca,                                   dizziness, palpitations Resp:   shortness of breath with exertion or at rest.                productive cough,   non-productive cough, coughing up of blood.              change in color of mucus.  wheezing.   Skin:    rash or lesions. GI:  No-   heartburn, indigestion, abdominal pain, nausea, vomiting, diarrhea,                 change in bowel habits, loss of appetite GU: dysuria, change in color of urine, no urgency  or frequency.   flank pain. MS:   joint pain, stiffness, decreased range of motion, back pain. Neuro-     nothing unusual Psych:  change in mood or affect.  depression or anxiety.   memory loss.  OBJ- Physical Exam General- Alert, Oriented, Affect-appropriate, Distress- none acute, + overweight Skin- rash-none, lesions- none, excoriation- none Lymphadenopathy- none Head- atraumatic            Eyes- Gross vision intact, PERRLA, conjunctivae and secretions clear            Ears- Hearing, canals-normal            Nose- Clear, no-Septal dev, mucus, polyps, erosion, perforation             Throat- Mallampati III-IV,  mucosa clear , drainage- none, tonsils- atrophic, + teeth Neck- flexible , trachea midline, no stridor , thyroid nl, carotid no bruit Chest - symmetrical excursion , unlabored           Heart/CV- RRR , no murmur , no gallop  , no rub, nl s1 s2                           - JVD- none , edema- none, stasis changes- none, varices- none           Lung- clear to P&A, wheeze- none, cough- none , dullness-none, rub- none           Chest wall-  Abd-  Br/ Gen/ Rectal- Not done, not indicated Extrem- cyanosis- none, clubbing, none, atrophy- none, strength- nl Neuro- grossly intact to observation

## 2021-03-19 ENCOUNTER — Encounter: Payer: Self-pay | Admitting: Internal Medicine

## 2021-03-19 ENCOUNTER — Other Ambulatory Visit: Payer: Self-pay

## 2021-03-19 ENCOUNTER — Ambulatory Visit (INDEPENDENT_AMBULATORY_CARE_PROVIDER_SITE_OTHER): Payer: BC Managed Care – PPO | Admitting: Internal Medicine

## 2021-03-19 VITALS — BP 116/80 | HR 89 | Temp 98.7°F | Ht 61.0 in | Wt 178.8 lb

## 2021-03-19 DIAGNOSIS — R0683 Snoring: Secondary | ICD-10-CM | POA: Diagnosis not present

## 2021-03-19 DIAGNOSIS — E039 Hypothyroidism, unspecified: Secondary | ICD-10-CM

## 2021-03-19 NOTE — Patient Instructions (Signed)
Order- schedule home sleep test   dx snoring  Please call us about 2 weeks after your test for results and recommendations

## 2021-03-21 ENCOUNTER — Encounter: Payer: Self-pay | Admitting: Internal Medicine

## 2021-03-21 DIAGNOSIS — R0683 Snoring: Secondary | ICD-10-CM | POA: Insufficient documentation

## 2021-03-21 DIAGNOSIS — G4733 Obstructive sleep apnea (adult) (pediatric): Secondary | ICD-10-CM | POA: Insufficient documentation

## 2021-03-21 NOTE — Assessment & Plan Note (Signed)
Probable OSA. Appropriate discussion and questions answered. Plan- sleep sstudy.

## 2021-03-21 NOTE — Assessment & Plan Note (Signed)
Being managed

## 2021-04-02 ENCOUNTER — Other Ambulatory Visit: Payer: Self-pay | Admitting: Family Medicine

## 2021-04-02 DIAGNOSIS — I10 Essential (primary) hypertension: Secondary | ICD-10-CM

## 2021-05-01 ENCOUNTER — Other Ambulatory Visit: Payer: Self-pay

## 2021-05-01 ENCOUNTER — Ambulatory Visit: Payer: BC Managed Care – PPO

## 2021-05-01 DIAGNOSIS — G4733 Obstructive sleep apnea (adult) (pediatric): Secondary | ICD-10-CM | POA: Diagnosis not present

## 2021-05-01 DIAGNOSIS — R0683 Snoring: Secondary | ICD-10-CM

## 2021-05-11 DIAGNOSIS — G4733 Obstructive sleep apnea (adult) (pediatric): Secondary | ICD-10-CM | POA: Diagnosis not present

## 2021-06-23 ENCOUNTER — Other Ambulatory Visit: Payer: Self-pay | Admitting: Family Medicine

## 2021-06-23 DIAGNOSIS — I10 Essential (primary) hypertension: Secondary | ICD-10-CM

## 2021-06-24 ENCOUNTER — Ambulatory Visit: Payer: BC Managed Care – PPO | Admitting: Endocrinology

## 2021-06-24 ENCOUNTER — Other Ambulatory Visit: Payer: Self-pay

## 2021-06-24 VITALS — BP 122/78 | HR 94 | Ht 61.0 in | Wt 177.4 lb

## 2021-06-24 DIAGNOSIS — E039 Hypothyroidism, unspecified: Secondary | ICD-10-CM | POA: Diagnosis not present

## 2021-06-24 LAB — TSH: TSH: 2.98 u[IU]/mL (ref 0.35–5.50)

## 2021-06-24 LAB — T4, FREE: Free T4: 0.52 ng/dL — ABNORMAL LOW (ref 0.60–1.60)

## 2021-06-24 MED ORDER — LEVOTHYROXINE SODIUM 50 MCG PO TABS: 50.0000 ug | ORAL_TABLET | Freq: Every day | ORAL | 3 refills | Status: DC

## 2021-06-24 NOTE — Patient Instructions (Addendum)
Blood tests are requested for you today.  We'll let you know about the results.   Based on the results, you will probably need to resume the medication.   Please come back for a follow-up appointment in 1 year.

## 2021-06-24 NOTE — Progress Notes (Signed)
Subjective:    Patient ID: Cassidy [name], female    DOB: 11/01/1985, 35 y.o.   MRN: [id number]  HPI Pt returns for f/u of post-RAI hypothyroidism (she had RAI rx for hyperthyroidism in 2017, in [place] she has been on Synthroid since soon thereafter). She has been out of synthroid 1 week ago.  When she resumed it in 2020, pt states she felt no different, and well in general.  She is not at risk for pregnancy.   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   POS HIGH RISK HPV   HSV infection    Hypertension    Hypothyroidism    Migraine    Pneumonia 2013   Pregnancy induced hypertension    UTI (urinary tract infection)    Vaginal Pap smear, abnormal     Past Surgical History:  Procedure Laterality Date   BREAST BIOPSY     CESAREAN SECTION  last 2018   x2   COLPOSCOPY  2005   DILATATION & CURETTAGE/HYSTEROSCOPY WITH MYOSURE N/A 10/19/2019   Procedure: DILATATION & CURETTAGE/HYSTEROSCOPY WITH MYOSURE VAGINAL ABLATION OF ENDOMETRIOSIS;  Surgeon: Genia Del, MD;  Location: [facility] ;  Service: Gynecology;  Laterality: N/A;  request 7:30am OR time in [place] Gyn block requests 30 minutes OR time   thyroid ablation  2017    Social History   Socioeconomic History   Marital status: Married    Spouse name: Not on file   Number of children: 2   Years of education: Not on file   Highest education level: Not on file  Occupational History   Not on file  Tobacco Use   Smoking status: Never   Smokeless tobacco: Never  Vaping Use   Vaping Use: Never used  Substance and Sexual Activity   Alcohol use: No   Drug use: No   Sexual activity: Yes    Birth control/protection: None, Surgical    Comment: declined insurancde questions  Other Topics Concern   Not on file  Social History Narrative   Lives with husband and 2 kids.    Social Determinants of  Health   Financial Resource Strain: Not on file  Food Insecurity: Not on file  Transportation Needs: Not on file  Physical Activity: Not on file  Stress: Not on file  Social Connections: Not on file  Intimate Partner Violence: Not on file    Current Outpatient Medications on File Prior to Visit  Medication Sig Dispense Refill   amLODipine (NORVASC) 5 MG tablet TAKE 1 TABLET(5 MG) BY MOUTH DAILY 60 tablet 0   cyclobenzaprine (FLEXERIL) 5 MG tablet Take 1 tablet (5 mg total) by mouth 3 (three) times daily as needed for muscle spasms. 30 tablet 0   norethindrone (MICRONOR) 0.35 MG tablet Take 1 tablet (0.35 mg total) by mouth daily. 84 tablet 4   No current facility-administered medications on file prior to visit.    Allergies  Allergen Reactions   Latex Rash    swellling when touches    Family History  Problem Relation Age of Onset   Hypertension Father    Hypothyroidism Father    Hypertension Mother    Diabetes Mother    Hypothyroidism Mother     BP 122/78 (BP Location: Right Arm, Patient Position: Sitting, Cuff Size: Large)   Pulse 94   Ht 5\' 1"  (1.549 m)   Wt  177 lb 6.4 oz (80.5 kg)   SpO2 99%   BMI 33.52 kg/m    Review of Systems     Objective:   Physical Exam NECK: There is no palpable thyroid enlargement.  No thyroid nodule is palpable.  No palpable lymphadenopathy at the anterior neck.  Lab Results  Component Value Date   TSH 2.98 06/24/2021  T4=0.52    Assessment & Plan:  Hypothyroidism: uncontrolled, due to noncompliance.  We discussed.  She would like to recheck TFT, prior to resuming rx.   Update: Please resume the same synthroid dosage

## 2021-07-20 ENCOUNTER — Ambulatory Visit: Payer: BC Managed Care – PPO | Admitting: Internal Medicine

## 2021-09-08 ENCOUNTER — Telehealth: Payer: Self-pay | Admitting: Internal Medicine

## 2021-09-08 NOTE — Telephone Encounter (Signed)
Pt's upcoming appt has been changed to a video visit. Attempted to call pt to let her know this had been done but unable to reach. Left pt a detailed message so she is aware. Nothing further needed.

## 2021-09-09 NOTE — Progress Notes (Signed)
03/19/21- 34 yoF never smoker for sleep evaluation  Medical problem list includes Migraine, HTN, hx Pneumonia, Hypothyroid,  Epworth score-12 Body weight today-178 lbs Covid vax- J&J, 1 Phizer -----Snoring, loud breathing, and gasping witnessed by husband. Started while pregnant 4 years ago and has gotten worse since then.  Tired. No sleep meds,little caffeine. No ENT surgery. Has septal deviation.. Hypothyroid- managed.  Both parents on CPAP.   09/10/21  Virtual Visit via Video Note  I connected with Cassidy [name] on 09/09/21 at 10:30 AM EST by a video enabled telemedicine application and verified that I am speaking with the correct person using two identifiers.  Location: Patient: home Provider: office   I discussed the limitations of evaluation and management by telemedicine and the availability of in person appointments. The patient expressed understanding and agreed to proceed.  History of Present Illness: 69 yoF followed for OSA, complicated by Migraine, HTN, hx Pneumonia, Hypothyroid,  HST 05/02/21- AHI 9/ hr, desaturation to 82%, body weight 178 lbs Covid vax-1 J&J, 1 Phizer Flu vax-declines I reviewed management of mild sleep apnea including conservative options-weight loss and sleep off flat of back.  Snoring is a significant issue at home and she chooses to try CPAP which we reviewed.   Observations/Objective:   Assessment and Plan: Mild obstructive sleep apnea.  We are going to start CPAP.  Follow Up Instructions: We will need to see her again 31 to 90 days after she gets her machine.   I discussed the assessment and treatment plan with the patient. The patient was provided an opportunity to ask questions and all were answered. The patient agreed with the plan and demonstrated an understanding of the instructions.   The patient was advised to call back or seek an in-person evaluation if the symptoms worsen or if the condition fails to improve as anticipated.  I  provided 20 minutes of non-face-to-face time during this encounter.   Jetty Duhamel, MD   ROS-see HPI   + = positive Constitutional:    weight loss, night sweats, fevers, chills, fatigue, lassitude. HEENT:    headaches, difficulty swallowing, tooth/dental problems, sore throat,       sneezing, itching, ear ache, nasal congestion, post nasal drip, snoring CV:    chest pain, orthopnea, PND, swelling in lower extremities, anasarca,                                   dizziness, palpitations Resp:   shortness of breath with exertion or at rest.                productive cough,   non-productive cough, coughing up of blood.              change in color of mucus.  wheezing.   Skin:    rash or lesions. GI:  No-   heartburn, indigestion, abdominal pain, nausea, vomiting, diarrhea,                 change in bowel habits, loss of appetite GU: dysuria, change in color of urine, no urgency or frequency.   flank pain. MS:   joint pain, stiffness, decreased range of motion, back pain. Neuro-     nothing unusual Psych:  change in mood or affect.  depression or anxiety.   memory loss. ---------------------------------------- OBJ- Physical Exam General- Alert, Oriented, Affect-appropriate, Distress- none acute, + overweight Skin- rash-none, lesions- none, excoriation- none  Lymphadenopathy- none Head- atraumatic            Eyes- Gross vision intact, PERRLA, conjunctivae and secretions clear            Ears- Hearing, canals-normal            Nose- Clear, no-Septal dev, mucus, polyps, erosion, perforation             Throat- Mallampati III-IV,  mucosa clear , drainage- none, tonsils- atrophic, + teeth Neck- flexible , trachea midline, no stridor , thyroid nl, carotid no bruit Chest - symmetrical excursion , unlabored           Heart/CV- RRR , no murmur , no gallop  , no rub, nl s1 s2                           - JVD- none , edema- none, stasis changes- none, varices- none           Lung- clear to P&A,  wheeze- none, cough- none , dullness-none, rub- none           Chest wall-  Abd-  Br/ Gen/ Rectal- Not done, not indicated Extrem- cyanosis- none, clubbing, none, atrophy- none, strength- nl Neuro- grossly intact to observation

## 2021-09-10 ENCOUNTER — Encounter: Payer: Self-pay | Admitting: Internal Medicine

## 2021-09-10 ENCOUNTER — Telehealth (INDEPENDENT_AMBULATORY_CARE_PROVIDER_SITE_OTHER): Payer: BC Managed Care – PPO | Admitting: Internal Medicine

## 2021-09-10 ENCOUNTER — Other Ambulatory Visit: Payer: Self-pay

## 2021-09-10 VITALS — Ht 61.0 in | Wt 170.0 lb

## 2021-09-10 DIAGNOSIS — G4733 Obstructive sleep apnea (adult) (pediatric): Secondary | ICD-10-CM

## 2021-09-10 NOTE — Patient Instructions (Signed)
Order- new DME, new CPAP    dx OSA  We will need to see you again between 31 and 90 days after you get your CPAP machine, so let us know when you get it. The home care company will tell you how long the wait will be.

## 2021-11-20 ENCOUNTER — Telehealth: Payer: Self-pay | Admitting: Internal Medicine

## 2021-11-23 NOTE — Telephone Encounter (Signed)
Spoke with pt and informed her that she would have to call DME companies to find one with a price point she was comfortable with. Once pt has picked DME she will let office know and we will place order with that DME at that time. Pt stated understanding. Nothing further needed at this time.

## 2021-11-23 NOTE — Telephone Encounter (Signed)
Called patient and she states that she is currently using [facility] for her CPAP machine and supplies and would like to change companies. She states that [employer] is too expensive for her.She would like to know if we know any companies that are cheaper. I am trying to figure this out for her before I place the order to change DME companies.  [facility] please advise

## 2021-11-23 NOTE — Telephone Encounter (Signed)
Unfortunately we would not know which DME will be more cost effective for her.  That is between the patient & the DME.

## 2021-11-30 ENCOUNTER — Other Ambulatory Visit: Payer: Self-pay

## 2021-11-30 ENCOUNTER — Ambulatory Visit
Admission: EM | Admit: 2021-11-30 | Discharge: 2021-11-30 | Disposition: A | Discharge status: Home / Self Care | Payer: BC Managed Care – PPO | Attending: Internal Medicine | Admitting: Internal Medicine

## 2021-11-30 ENCOUNTER — Encounter: Payer: Self-pay | Admitting: Internal Medicine

## 2021-11-30 DIAGNOSIS — J309 Allergic rhinitis, unspecified: Secondary | ICD-10-CM | POA: Diagnosis not present

## 2021-11-30 DIAGNOSIS — R519 Headache, unspecified: Secondary | ICD-10-CM

## 2021-11-30 MED ORDER — METHYLPREDNISOLONE 4 MG PO TBPK: ORAL_TABLET | ORAL | 0 refills | Status: DC

## 2021-11-30 MED ORDER — AMOXICILLIN-POT CLAVULANATE 875-125 MG PO TABS: 1.0000 | ORAL_TABLET | Freq: Two times a day (BID) | ORAL | 0 refills | Status: DC

## 2021-11-30 NOTE — ED Triage Notes (Signed)
Pt here with 2 weeks of sinus congestion and pressure, ear fullness and headache. Pt does require diflucan if prescribed abx.

## 2021-11-30 NOTE — Discharge Instructions (Signed)
If in 3 days your nose congestion does not improve, then fill the prescription for the antibiotic

## 2021-11-30 NOTE — ED Provider Notes (Signed)
Renaldo Fiddler    CSN: [id number] Arrival date & time: 11/30/21  1745      History   Chief Complaint Chief Complaint  Patient presents with   Nasal Congestion   Headache   Ear Fullness    HPI Cassidy [name] is a 36 y.o. female who presents here hx of sinus congestion and pressure x 2 weeks, and ear fullness and HA. She is prone to yeast infections with antibiotics. She cant pass air through her nose. Flonase seems to make her congestion worse. Nose mucosus is clear to yellow. She deniers hx of fever. Has been sneezing a lot.    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   POS HIGH RISK HPV   HSV infection    Hypertension    Hypothyroidism    Migraine    Pneumonia 2013   Pregnancy induced hypertension    UTI (urinary tract infection)    Vaginal Pap smear, abnormal     Patient Active Problem List   Diagnosis Date Noted   Snoring 03/21/2021   Hypothyroidism 07/11/2019   Dysplasia of cervix, low grade (CIN 1)    High risk HPV infection     Past Surgical History:  Procedure Laterality Date   BREAST BIOPSY     CESAREAN SECTION  last 2018   x2   COLPOSCOPY  2005   DILATATION & CURETTAGE/HYSTEROSCOPY WITH MYOSURE N/A 10/19/2019   Procedure: DILATATION & CURETTAGE/HYSTEROSCOPY WITH MYOSURE VAGINAL ABLATION OF ENDOMETRIOSIS;  Surgeon: Genia Del, MD;  Location: [facility];  Service: Gynecology;  Laterality: N/A;  request 7:30am OR time in [place] [facility] block requests 30 minutes OR time   thyroid ablation  2017    OB History     Gravida  3   Para  1   Term      Preterm  1   AB  1   Living  2      SAB  1   IAB      Ectopic      Multiple      Live Births  1            Home Medications    Prior to Admission medications   Medication Sig Start Date End Date Taking? Authorizing Provider  amoxicillin-clavulanate  (AUGMENTIN) 875-125 MG tablet Take 1 tablet by mouth every 12 (twelve) hours. 11/30/21  Yes Rodriguez-Southworth, Nettie Elm, PA-C  methylPREDNISolone (MEDROL DOSEPAK) 4 MG TBPK tablet Take as directed 11/30/21  Yes Rodriguez-Southworth, Nettie Elm, PA-C  amLODipine (NORVASC) 5 MG tablet TAKE 1 TABLET(5 MG) BY MOUTH DAILY 06/24/21   Deeann Saint, MD  levothyroxine (SYNTHROID) 50 MCG tablet Take 1 tablet (50 mcg total) by mouth daily. 06/24/21   Romero Belling, MD  norethindrone (MICRONOR) 0.35 MG tablet Take 1 tablet (0.35 mg total) by mouth daily. 03/16/21   Genia Del, MD    Family History Family History  Problem Relation Age of Onset   Hypertension Father    Hypothyroidism Father    Hypertension Mother    Diabetes Mother    Hypothyroidism Mother     Social History Social History   Tobacco Use   Smoking status: Never   Smokeless tobacco: Never  Vaping Use   Vaping Use: Never used  Substance Use Topics   Alcohol use: No   Drug use: No     Allergies  Latex   Review of Systems Review of Systems  Constitutional:  Negative for chills and fever.  HENT:  Positive for congestion, ear pain, postnasal drip, rhinorrhea and sneezing. Negative for ear discharge.   Eyes:  Negative for discharge.  Respiratory:  Positive for cough.   Musculoskeletal:  Negative for myalgias.  Skin:  Negative for rash.  Neurological:  Negative for headaches.    Physical Exam Triage Vital Signs ED Triage Vitals  Enc Vitals Group     BP 11/30/21 1841 130/83     Pulse Rate 11/30/21 1841 85     Resp 11/30/21 1841 16     Temp 11/30/21 1841 98.3 F (36.8 C)     Temp Source 11/30/21 1841 Oral     SpO2 11/30/21 1841 99 %     Weight --      Height --      Head Circumference --      Peak Flow --      Pain Score 11/30/21 1858 5     Pain Loc --      Pain Edu? --      Excl. in GC? --    No data found.  Updated Vital Signs BP 130/83 (BP Location: Left Arm)    Pulse 85    Temp 98.3 F (36.8 C)  (Oral)    Resp 16    SpO2 99%   Visual Acuity Right Eye Distance:   Left Eye Distance:   Bilateral Distance:    Right Eye Near:   Left Eye Near:    Bilateral Near:     Physical Exam Vitals and nursing note reviewed.  Constitutional:      General: She is not in acute distress.    Appearance: She is not toxic-appearing.  HENT:     Head: Normocephalic.     Right Ear: Ear canal and external ear normal.     Left Ear: Ear canal and external ear normal.     Ears:     Comments: Both are dull and gray, but worse on the R    Nose: Mucosal edema and congestion present.  Eyes:     Extraocular Movements: Extraocular movements intact.  Pulmonary:     Effort: Pulmonary effort is normal.  Musculoskeletal:     Cervical back: Neck supple.  Lymphadenopathy:     Cervical: No cervical adenopathy.  Skin:    General: Skin is warm and dry.  Neurological:     Mental Status: She is alert.     UC Treatments / Results  Labs (all labs ordered are listed, but only abnormal results are displayed) Labs Reviewed - No data to display  EKG   Radiology No results found.  Procedures Procedures (including critical care time)  Medications Ordered in UC Medications - No data to display  Initial Impression / Assessment and Plan / UC Course  I have reviewed the triage vital signs and the nursing notes. R SOM Allergic rhinitis Pt was advised to start with medrol dose pack. If in 3 days, she still has the nose congestion and sinus pressure, then may start the Augmentin rx which I printed and handed it to her.      Final Clinical Impressions(s) / UC Diagnoses   Final diagnoses:  Sinus headache  Allergic rhinitis, unspecified seasonality, unspecified trigger     Discharge Instructions      If in 3 days your nose congestion does not improve, then fill the prescription for the antibiotic  ED Prescriptions     Medication Sig Dispense Auth. Provider   methylPREDNISolone (MEDROL  DOSEPAK) 4 MG TBPK tablet Take as directed 21 tablet Rodriguez-Southworth, Carlitos Bottino, PA-C   amoxicillin-clavulanate (AUGMENTIN) 875-125 MG tablet Take 1 tablet by mouth every 12 (twelve) hours. 14 tablet Rodriguez-Southworth, Nettie Elm, PA-C      PDMP not reviewed this encounter.   Garey Ham, Cordelia Poche 11/30/21 2148

## 2022-01-27 ENCOUNTER — Ambulatory Visit: Payer: BC Managed Care – PPO | Admitting: Endocrinology

## 2022-01-27 VITALS — BP 134/90 | HR 96 | Ht 61.0 in | Wt 184.8 lb

## 2022-01-27 DIAGNOSIS — E039 Hypothyroidism, unspecified: Secondary | ICD-10-CM

## 2022-01-27 LAB — TSH: TSH: 3.27 u[IU]/mL (ref 0.35–5.50)

## 2022-01-27 LAB — T4, FREE: Free T4: 0.61 ng/dL (ref 0.60–1.60)

## 2022-01-27 NOTE — Patient Instructions (Addendum)
Blood tests are requested for you today.  We'll let you know about the results.   ?It is best to never miss the medication.  However, if you do miss it, next best is to double up the next time.   ?You should have an endocrinology follow-up appointment in 1 year.  ?

## 2022-01-27 NOTE — Progress Notes (Signed)
? ?Subjective:  ? ? Patient ID: Cassidy [name], female    DOB: 03-May-1986, 36 y.o.   MRN: [id number] ? ?HPI ?Pt returns for f/u of post-RAI hypothyroidism (she had RAI rx for hyperthyroidism in 2017, in [place], [place]; she has been on Synthroid since soon thereafter). pt states she feels well in general.  She is not at risk for pregnancy.  She takes synthroid as rx'ed.   ?Past Medical History:  ?Diagnosis Date  ? Dysplasia of cervix, low grade (CIN 1) 08/2005 AND 04/2008  ? 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 POS HIGH RISK HPV  ? HSV infection   ? Hypertension   ? Hypothyroidism   ? Migraine   ? Pneumonia 2013  ? Pregnancy induced hypertension   ? UTI (urinary tract infection)   ? Vaginal Pap smear, abnormal   ? ? ?Past Surgical History:  ?Procedure Laterality Date  ? BREAST BIOPSY    ? CESAREAN SECTION  last 2018  ? x2  ? COLPOSCOPY  2005  ? DILATATION & CURETTAGE/HYSTEROSCOPY WITH MYOSURE N/A 10/19/2019  ? Procedure: DILATATION & CURETTAGE/HYSTEROSCOPY WITH MYOSURE VAGINAL ABLATION OF ENDOMETRIOSIS;  Surgeon: Genia Del, MD;  Location: [facility];  Service: Gynecology;  Laterality: N/A;  request 7:30am OR time in [place] block ?requests 30 minutes OR time  ? thyroid ablation  2017  ? ? ?Social History  ? ?Socioeconomic History  ? Marital status: Married  ?  Spouse name: Not on file  ? Number of children: 2  ? Years of education: Not on file  ? Highest education level: Not on file  ?Occupational History  ? Not on file  ?Tobacco Use  ? Smoking status: Never  ? Smokeless tobacco: Never  ?Vaping Use  ? Vaping Use: Never used  ?Substance and Sexual Activity  ? Alcohol use: No  ? Drug use: No  ? Sexual activity: Yes  ?  Birth control/protection: None, Surgical  ?  Comment: declined insurancde questions  ?Other Topics Concern  ? Not on file  ?Social History Narrative  ? Lives with husband and 2 kids.   ? ?Social Determinants of Health  ? ?Financial Resource Strain: Not on file  ?Food  Insecurity: Not on file  ?Transportation Needs: Not on file  ?Physical Activity: Not on file  ?Stress: Not on file  ?Social Connections: Not on file  ?Intimate Partner Violence: Not on file  ? ? ?Current Outpatient Medications on File Prior to Visit  ?Medication Sig Dispense Refill  ? amLODipine (NORVASC) 5 MG tablet TAKE 1 TABLET(5 MG) BY MOUTH DAILY 60 tablet 0  ? amoxicillin-clavulanate (AUGMENTIN) 875-125 MG tablet Take 1 tablet by mouth every 12 (twelve) hours. 14 tablet 0  ? levothyroxine (SYNTHROID) 50 MCG tablet Take 1 tablet (50 mcg total) by mouth daily. 90 tablet 3  ? methylPREDNISolone (MEDROL DOSEPAK) 4 MG TBPK tablet Take as directed 21 tablet 0  ? norethindrone (MICRONOR) 0.35 MG tablet Take 1 tablet (0.35 mg total) by mouth daily. 84 tablet 4  ? ?No current facility-administered medications on file prior to visit.  ? ? ?Allergies  ?Allergen Reactions  ? Latex Rash  ?  swellling when touches  ? ? ?Family History  ?Problem Relation Age of Onset  ? Hypertension Father   ? Hypothyroidism Father   ? Hypertension Mother   ? Diabetes Mother   ? Hypothyroidism Mother   ? ? ?BP 134/90 (BP Location: Left Arm, Patient Position: Sitting, Cuff Size: Normal)  Pulse 96   Ht 5\' 1"  (1.549 m)   Wt 184 lb 12.8 oz (83.8 kg)   SpO2 100%   BMI 34.92 kg/m?  ? ? ?Review of Systems ? ?   ?Objective:  ? Physical Exam ?VITAL SIGNS:  See vs page ?GENERAL: no distress ?NECK: There is no palpable thyroid enlargement.  No thyroid nodule is palpable.  No palpable lymphadenopathy at the anterior neck.   ? ? ?Lab Results  ?Component Value Date  ? TSH 3.27 01/27/2022  ? ?   ?Assessment & Plan:  ?Hypothyroidism: well-controlled.  Please continue the same synthroid.   ? ?

## 2022-03-18 ENCOUNTER — Ambulatory Visit: Payer: BC Managed Care – PPO | Admitting: Obstetrics & Gynecology

## 2022-04-02 ENCOUNTER — Other Ambulatory Visit (HOSPITAL_COMMUNITY)
Admission: RE | Admit: 2022-04-02 | Discharge: 2022-04-02 | Disposition: A | Discharge status: Home / Self Care | Payer: BC Managed Care – PPO | Source: Ambulatory Visit | Attending: Obstetrics & Gynecology | Admitting: Obstetrics & Gynecology

## 2022-04-02 ENCOUNTER — Ambulatory Visit (INDEPENDENT_AMBULATORY_CARE_PROVIDER_SITE_OTHER): Payer: BC Managed Care – PPO | Admitting: Obstetrics & Gynecology

## 2022-04-02 ENCOUNTER — Encounter: Payer: Self-pay | Admitting: Obstetrics & Gynecology

## 2022-04-02 VITALS — BP 112/68 | HR 85 | Ht 60.25 in | Wt 184.0 lb

## 2022-04-02 DIAGNOSIS — Z01419 Encounter for gynecological examination (general) (routine) without abnormal findings: Secondary | ICD-10-CM | POA: Insufficient documentation

## 2022-04-02 DIAGNOSIS — N920 Excessive and frequent menstruation with regular cycle: Secondary | ICD-10-CM | POA: Diagnosis not present

## 2022-04-02 DIAGNOSIS — Z9851 Tubal ligation status: Secondary | ICD-10-CM | POA: Diagnosis not present

## 2022-04-02 DIAGNOSIS — N945 Secondary dysmenorrhea: Secondary | ICD-10-CM

## 2022-04-02 MED ORDER — NORETHIN ACE-ETH ESTRAD-FE 1-20 MG-MCG(24) PO TABS: 1.0000 | ORAL_TABLET | Freq: Every day | ORAL | 4 refills | Status: DC

## 2022-04-02 NOTE — Progress Notes (Signed)
Cassidy [name] 1986/07/10 [id number]   History:    36 y.o. [id number] Married.  S/P TL.    RP:  Established patient presenting for annual gyn exam    HPI: Menses much lighter on the Progestin only pill.  Frequent BTB though.  Would like to change to another pill.  Had a HSC/Myosure excision of Polyp/D+C in 10/2019 with me.  S/P Tubal ligation.  No pelvic pain.  No pain with IC.  No H/O abnormal Pap.  Pap Neg 03/2021. Pap Reflex today.  Breasts normal.  BMI increased to 35.64.  Needs to increase fitness activities.  Low calorie/carb diet. Health labs with Fam MD.  cHTN on Meds.  Gardasil series completed.     Past medical history,surgical history, family history and social history were all reviewed and documented in the EPIC chart.  Gynecologic History Patient's last menstrual period was 03/23/2022.  Obstetric History OB History  Gravida Para Term Preterm AB Living  3 1   1 1 2   SAB IAB Ectopic Multiple Live Births  1       2    # Outcome Date GA Lbr Len/2nd Weight Sex Delivery Anes PTL Lv  3 Preterm 04/30/14 [redacted]w[redacted]d    CS-LTranv        Complications: Pre-eclampsia  2 Gravida           1 SAB              ROS: A ROS was performed and pertinent positives and negatives are included in the history.  GENERAL: No fevers or chills. HEENT: No change in vision, no earache, sore throat or sinus congestion. NECK: No pain or stiffness. CARDIOVASCULAR: No chest pain or pressure. No palpitations. PULMONARY: No shortness of breath, cough or wheeze. GASTROINTESTINAL: No abdominal pain, nausea, vomiting or diarrhea, melena or bright red blood per rectum. GENITOURINARY: No urinary frequency, urgency, hesitancy or dysuria. MUSCULOSKELETAL: No joint or muscle pain, no back pain, no recent trauma. DERMATOLOGIC: No rash, no itching, no lesions. ENDOCRINE: No polyuria, polydipsia, no heat or cold intolerance. No recent change in weight. HEMATOLOGICAL: No anemia or easy bruising or bleeding. NEUROLOGIC: No  headache, seizures, numbness, tingling or weakness. PSYCHIATRIC: No depression, no loss of interest in normal activity or change in sleep pattern.     Exam:   BP 112/68   Pulse 85   Ht 5' 0.25" (1.53 m)   Wt 184 lb (83.5 kg)   LMP 03/23/2022 Comment: micronor, btl  SpO2 95%   BMI 35.64 kg/m   Body mass index is 35.64 kg/m.  General appearance : Well developed well nourished female. No acute distress HEENT: Eyes: no retinal hemorrhage or exudates,  Neck supple, trachea midline, no carotid bruits, no thyroidmegaly Lungs: Clear to auscultation, no rhonchi or wheezes, or rib retractions  Heart: Regular rate and rhythm, no murmurs or gallops Breast:Examined in sitting and supine position were symmetrical in appearance, no palpable masses or tenderness,  no skin retraction, no nipple inversion, no nipple discharge, no skin discoloration, no axillary or supraclavicular lymphadenopathy Abdomen: no palpable masses or tenderness, no rebound or guarding Extremities: no edema or skin discoloration or tenderness  Pelvic: Vulva: Normal             Vagina: No gross lesions or discharge  Cervix: No gross lesions or discharge.  Pap reflex done.  Uterus  AV, normal size, shape and consistency, non-tender and mobile  Adnexa  Without masses or tenderness  Anus: Normal  Assessment/Plan:  36 y.o. female for annual gyn exam  1. Encounter for routine gynecological examination with Papanicolaou smear of cervix Menses much lighter on the Progestin only pill.  Frequent BTB though.  Would like to change to another pill.  Had a HSC/Myosure excision of Polyp/D+C in 10/2019 with me.  S/P Tubal ligation.  No pelvic pain.  No pain with IC.  No H/O abnormal Pap.  Pap Neg 03/2021. Pap Reflex today.  Breasts normal.  BMI increased to 35.64.  Needs to increase fitness activities.  Low calorie/carb diet. Health labs with Fam MD.  cHTN on Meds.  Gardasil series completed.  - Cytology - PAP( [place])  2. S/P  tubal ligation  3. Menorrhagia with regular cycle Controled on the Progestin pill, but too much BTB.  Declines the IUD with Progesterone. Decision to change to a low Estrogen/Progestin pill.  cHTN well controled on medication.  Will start on Lomedia 24 1/20.  Usage reviewed.  Will monitor her BP and call back if notes an increase in BP.  4. Secondary dysmenorrhea May use BCPs continuously.  Other orders - Norethindrone Acetate-Ethinyl Estrad-FE (LOESTRIN 24 FE) 1-20 MG-MCG(24) tablet; Take 1 tablet by mouth daily. May use continuously.   Genia Del MD, 9:23 AM 04/02/2022

## 2022-04-09 LAB — CYTOLOGY - PAP
Comment: NEGATIVE
Diagnosis: UNDETERMINED — AB
High risk HPV: NEGATIVE

## 2022-04-11 ENCOUNTER — Other Ambulatory Visit: Payer: Self-pay | Admitting: Family Medicine

## 2022-04-11 DIAGNOSIS — I10 Essential (primary) hypertension: Secondary | ICD-10-CM

## 2022-04-12 ENCOUNTER — Encounter: Payer: Self-pay | Admitting: Family Medicine

## 2022-04-12 ENCOUNTER — Ambulatory Visit (INDEPENDENT_AMBULATORY_CARE_PROVIDER_SITE_OTHER): Payer: BC Managed Care – PPO | Admitting: Family Medicine

## 2022-04-12 VITALS — BP 122/78 | HR 86 | Temp 98.5°F | Wt 186.8 lb

## 2022-04-12 DIAGNOSIS — G4733 Obstructive sleep apnea (adult) (pediatric): Secondary | ICD-10-CM | POA: Diagnosis not present

## 2022-04-12 DIAGNOSIS — I10 Essential (primary) hypertension: Secondary | ICD-10-CM

## 2022-04-12 MED ORDER — AMLODIPINE BESYLATE 5 MG PO TABS: ORAL_TABLET | ORAL | 3 refills | Status: DC

## 2022-04-12 NOTE — Progress Notes (Signed)
Subjective:    Patient ID: Cassidy [name], female    DOB: 1986/03/30, 36 y.o.   MRN: [id number]  Chief Complaint  Patient presents with   Medication Refill    On Amlodipine    HPI Patient was seen today for f/u on HTN and med refill.  Patient states BP has been good on Norvasc 5 mg daily.  Patient occasionally checks BP at home typically in the 120s over 70s or 80s.  Patient denies headaches, chest pain, changes in vision, LE edema.  Recently diagnosed with sleep apnea should have CPAP later this week.  Reports waking up feeling tired but okay once up and moving.  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   POS HIGH RISK HPV   HSV infection    Hypertension    Hypothyroidism    Migraine    Pneumonia 2013   Pregnancy induced hypertension    UTI (urinary tract infection)    Vaginal Pap smear, abnormal     Allergies  Allergen Reactions   Latex Rash    swellling when touches    ROS General: Denies fever, chills, night sweats, changes in weight, changes in appetite HEENT: Denies headaches, ear pain, changes in vision, rhinorrhea, sore throat CV: Denies CP, palpitations, SOB, orthopnea Pulm: Denies SOB, cough, wheezing GI: Denies abdominal pain, nausea, vomiting, diarrhea, constipation GU: Denies dysuria, hematuria, frequency, vaginal discharge Msk: Denies muscle cramps, joint pains Neuro: Denies weakness, numbness, tingling Skin: Denies rashes, bruising Psych: Denies depression, anxiety, hallucinations    Objective:    Blood pressure 122/78, pulse 86, temperature 98.5 F (36.9 C), temperature source Oral, weight 186 lb 12.8 oz (84.7 kg), last menstrual period 03/23/2022, SpO2 99 %.  Gen. Pleasant, well-nourished, in no distress, normal affect   HEENT: [place]/AT, face symmetric, conjunctiva clear, no scleral icterus, PERRLA, EOMI, nares patent without drainage Lungs: no accessory  muscle use, CTAB, no wheezes or rales Cardiovascular: RRR, no m/r/g, no peripheral edema Neuro:  A&Ox3, CN II-XII intact, normal gait Skin:  Warm, no lesions/ rash   Wt Readings from Last 3 Encounters:  04/12/22 186 lb 12.8 oz (84.7 kg)  04/02/22 184 lb (83.5 kg)  01/27/22 184 lb 12.8 oz (83.8 kg)    Lab Results  Component Value Date   WBC 6.7 10/19/2019   HGB 10.5 (L) 10/19/2019   HCT 31.0 (L) 10/19/2019   PLT 392 10/19/2019   GLUCOSE 94 10/19/2019   CHOL 195 10/07/2017   TRIG 90 10/07/2017   HDL 40 10/07/2017   LDLCALC 137 (H) 10/07/2017   ALT 15 01/12/2018   AST 20 01/12/2018   NA 141 10/19/2019   K 3.4 (L) 10/19/2019   CL 108 10/19/2019   CREATININE 0.80 10/19/2019   BUN 11 10/19/2019   CO2 24 01/12/2018   TSH 3.27 01/27/2022    Assessment/Plan:  Essential hypertension  -Controlled -Continue Norvasc 5 mg daily -Continue lifestyle modifications -Continue checking BP at home and keeping a log to bring with you to clinic -Discussed regular CPAP will likely help improve HTN. - Plan: amLODipine (NORVASC) 5 MG tablet  OSA (obstructive sleep apnea) -Recently diagnosed -Awaiting CPAP -Continue lifestyle modifications -Continue follow-up with pulm/sleep medicine.  F/u as needed  Abbe Amsterdam, MD

## 2022-05-26 NOTE — Progress Notes (Deleted)
HPI- F never smoker followed for OSA, complicated by Migraine, HTN, hx Pneumonia, Hypothyroid,  HST 05/02/21- AHI 9/ hr, desaturation to 82%, body weight 178 lbs  ======================================================  03/19/21- 34 yoF never smoker for sleep evaluation  Medical problem list includes Migraine, HTN, hx Pneumonia, Hypothyroid,  Epworth score-12 Body weight today-178 lbs Covid vax- J&J, 1 Phizer -----Snoring, loud breathing, and gasping witnessed by husband. Started while pregnant 4 years ago and has gotten worse since then.  Tired. No sleep meds,little caffeine. No ENT surgery. Has septal deviation.. Hypothyroid- managed.  Both parents on CPAP.   09/10/21  Virtual Visit via Video Note  I connected with Cassidy [name] on 05/26/22 at 10:30 AM EDT by a video enabled telemedicine application and verified that I am speaking with the correct person using two identifiers.  Location: Patient: home Provider: office   I discussed the limitations of evaluation and management by telemedicine and the availability of in person appointments. The patient expressed understanding and agreed to proceed.  History of Present Illness: 73 yoF followed for OSA, complicated by Migraine, HTN, hx Pneumonia, Hypothyroid,  HST 05/02/21- AHI 9/ hr, desaturation to 82%, body weight 178 lbs Covid vax-1 J&J, 1 Phizer Flu vax-declines I reviewed management of mild sleep apnea including conservative options-weight loss and sleep off flat of back.  Snoring is a significant issue at home and she chooses to try CPAP which we reviewed.   Observations/Objective:   Assessment and Plan: Mild obstructive sleep apnea.  We are going to start CPAP.  Follow Up Instructions: We will need to see her again 31 to 90 days after she gets her machine.   I discussed the assessment and treatment plan with the patient. The patient was provided an opportunity to ask questions and all were answered. The patient  agreed with the plan and demonstrated an understanding of the instructions.   The patient was advised to call back or seek an in-person evaluation if the symptoms worsen or if the condition fails to improve as anticipated.  I provided 20 minutes of non-face-to-face time during this encounter.   Jetty Duhamel, MD  05/27/22- 35 yoF followed for OSA, complicated by Migraine, HTN, hx Pneumonia, Hypothyroid,  HST 05/02/21- AHI 9/ hr, desaturation to 82%, body weight 178 lbs Covid vax-1 J&J, 1 Phizer Body weight today- CPAP auto 5-15/ [facility]    ordered new 09/10/21 Download compliance-    ROS-see HPI   + = positive Constitutional:    weight loss, night sweats, fevers, chills, fatigue, lassitude. HEENT:    headaches, difficulty swallowing, tooth/dental problems, sore throat,       sneezing, itching, ear ache, nasal congestion, post nasal drip, snoring CV:    chest pain, orthopnea, PND, swelling in lower extremities, anasarca,                                   dizziness, palpitations Resp:   shortness of breath with exertion or at rest.                productive cough,   non-productive cough, coughing up of blood.              change in color of mucus.  wheezing.   Skin:    rash or lesions. GI:  No-   heartburn, indigestion, abdominal pain, nausea, vomiting, diarrhea,  change in bowel habits, loss of appetite GU: dysuria, change in color of urine, no urgency or frequency.   flank pain. MS:   joint pain, stiffness, decreased range of motion, back pain. Neuro-     nothing unusual Psych:  change in mood or affect.  depression or anxiety.   memory loss. ---------------------------------------- OBJ- Physical Exam General- Alert, Oriented, Affect-appropriate, Distress- none acute, + overweight Skin- rash-none, lesions- none, excoriation- none Lymphadenopathy- none Head- atraumatic            Eyes- Gross vision intact, PERRLA, conjunctivae and secretions clear            Ears-  Hearing, canals-normal            Nose- Clear, no-Septal dev, mucus, polyps, erosion, perforation             Throat- Mallampati III-IV,  mucosa clear , drainage- none, tonsils- atrophic, + teeth Neck- flexible , trachea midline, no stridor , thyroid nl, carotid no bruit Chest - symmetrical excursion , unlabored           Heart/CV- RRR , no murmur , no gallop  , no rub, nl s1 s2                           - JVD- none , edema- none, stasis changes- none, varices- none           Lung- clear to P&A, wheeze- none, cough- none , dullness-none, rub- none           Chest wall-  Abd-  Br/ Gen/ Rectal- Not done, not indicated Extrem- cyanosis- none, clubbing, none, atrophy- none, strength- nl Neuro- grossly intact to observation

## 2022-05-27 ENCOUNTER — Ambulatory Visit: Payer: BC Managed Care – PPO | Admitting: Internal Medicine

## 2022-06-01 ENCOUNTER — Encounter: Payer: Self-pay | Admitting: Emergency Medicine

## 2022-06-01 ENCOUNTER — Ambulatory Visit
Admission: EM | Admit: 2022-06-01 | Discharge: 2022-06-01 | Disposition: A | Discharge status: Home / Self Care | Payer: BC Managed Care – PPO | Attending: Family Medicine | Admitting: Family Medicine

## 2022-06-01 ENCOUNTER — Other Ambulatory Visit: Payer: Self-pay

## 2022-06-01 DIAGNOSIS — J019 Acute sinusitis, unspecified: Secondary | ICD-10-CM | POA: Diagnosis not present

## 2022-06-01 DIAGNOSIS — R519 Headache, unspecified: Secondary | ICD-10-CM

## 2022-06-01 DIAGNOSIS — R448 Other symptoms and signs involving general sensations and perceptions: Secondary | ICD-10-CM

## 2022-06-01 MED ORDER — PREDNISONE 10 MG PO TABS: 10.0000 mg | ORAL_TABLET | Freq: Every day | ORAL | 0 refills | Status: AC

## 2022-06-01 MED ORDER — IPRATROPIUM BROMIDE 0.03 % NA SOLN: 2.0000 | Freq: Three times a day (TID) | NASAL | 0 refills | Status: DC | PRN

## 2022-06-01 NOTE — ED Triage Notes (Signed)
Patient c/o sinus pressure and nasal congestion x 4-5 days.  Patient denies fever   Patient endorses worsening sinus pressure.   Patient endorses " pain in teeth".   Patient has taken Nyquil with no relief of symptoms.

## 2022-06-01 NOTE — Discharge Instructions (Signed)
Treating viral rhinosinusitis with prednisone 10 mg once daily for total of 5 days.  Take with food food to prevent abdominal upset.  Atrovent nasal spray you may use 2 sprays in each nares up to 3 times daily as needed for nasal symptoms. As discussed recommend taking over-the-counter antihistamine as well such as Zyrtec or Xyzal to prevent recurrence of nasal symptoms and to help manage outdoor allergens.  Drink plenty of water.  I have attached your work note to your discharge instructions.

## 2022-06-01 NOTE — ED Provider Notes (Signed)
Renaldo Fiddler    CSN: [id number] Arrival date & time: 06/01/22  1115      History   Chief Complaint Chief Complaint  Patient presents with   Facial Pain   Nasal Congestion    HPI Cassidy [name] is a 36 y.o. female.   HPI Patient presents today with a 5-day history of facial pressure, nasal congestion, along with pain in her teeth.  She reports that she has been taking NyQuil without any relief of her symptoms.  Denies fever, generalized body aches or known exposure to anyone who has been recently sick.  Denies any wheezing, shortness of breath or productive cough.  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   POS HIGH RISK HPV   HSV infection    Hypertension    Hypothyroidism    Migraine    Pneumonia 2013   Pregnancy induced hypertension    UTI (urinary tract infection)    Vaginal Pap smear, abnormal     Patient Active Problem List   Diagnosis Date Noted   Snoring 03/21/2021   Hypothyroidism 07/11/2019   Dysplasia of cervix, low grade (CIN 1)    High risk HPV infection     Past Surgical History:  Procedure Laterality Date   BREAST BIOPSY     CESAREAN SECTION  last 2018   x2   COLPOSCOPY  2005   DILATATION & CURETTAGE/HYSTEROSCOPY WITH MYOSURE N/A 10/19/2019   Procedure: DILATATION & CURETTAGE/HYSTEROSCOPY WITH MYOSURE VAGINAL ABLATION OF ENDOMETRIOSIS;  Surgeon: Genia Del, MD;  Location: [facility];  Service: Gynecology;  Laterality: N/A;  request 7:30am OR time in [place] Gyn block requests 30 minutes OR time   thyroid ablation  2017   TUBAL LIGATION      OB History     Gravida  3   Para  1   Term      Preterm  1   AB  1   Living  2      SAB  1   IAB      Ectopic      Multiple      Live Births  2            Home Medications    Prior to Admission medications   Medication Sig Start Date End Date  Taking? Authorizing Provider  amLODipine (NORVASC) 5 MG tablet TAKE 1 TABLET(5 MG) BY MOUTH DAILY 04/12/22  Yes Deeann Saint, MD  ipratropium (ATROVENT) 0.03 % nasal spray Place 2 sprays into both nostrils 3 (three) times daily as needed (Nasal symptoms and post nasal drainage). 06/01/22  Yes Bing Neighbors, FNP  levothyroxine (SYNTHROID) 50 MCG tablet Take 1 tablet (50 mcg total) by mouth daily. 06/24/21  Yes Romero Belling, MD  Norethindrone Acetate-Ethinyl Estrad-FE (LOESTRIN 24 FE) 1-20 MG-MCG(24) tablet Take 1 tablet by mouth daily. May use continuously. 04/02/22  Yes Genia Del, MD  predniSONE (DELTASONE) 10 MG tablet Take 1 tablet (10 mg total) by mouth daily with breakfast for 5 days. 06/01/22 06/06/22 Yes Bing Neighbors, FNP    Family History Family History  Problem Relation Age of Onset   Hypertension Father    Hypothyroidism Father    Hypertension Mother    Diabetes Mother    Hypothyroidism Mother     Social History Social History   Tobacco Use   Smoking status: Never  Smokeless tobacco: Never  Vaping Use   Vaping Use: Never used  Substance Use Topics   Alcohol use: No   Drug use: No     Allergies   Latex   Review of Systems Review of Systems Pertinent negatives listed in HPI   Physical Exam Triage Vital Signs ED Triage Vitals  Enc Vitals Group     BP 06/01/22 1152 128/85     Pulse Rate 06/01/22 1152 (!) 105     Resp 06/01/22 1152 16     Temp 06/01/22 1152 98 F (36.7 C)     Temp Source 06/01/22 1152 Oral     SpO2 06/01/22 1152 98 %     Weight --      Height --      Head Circumference --      Peak Flow --      Pain Score 06/01/22 1158 8     Pain Loc --      Pain Edu? --      Excl. in GC? --    No data found.  Updated Vital Signs BP 128/85 (BP Location: Right Arm)   Pulse (!) 105   Temp 98 F (36.7 C) (Oral)   Resp 16   LMP 05/02/2022 (Approximate)   SpO2 98%   Visual Acuity Right Eye Distance:   Left Eye Distance:    Bilateral Distance:    Right Eye Near:   Left Eye Near:    Bilateral Near:     Physical Exam  General Appearance:    Alert, cooperative, no distress  HENT:   Normocephalic, ears normal, nares mucosal edema, turbinates pale, with congestion, oropharynx patent without erythema   Eyes:    PERRL, conjunctiva/corneas clear, EOM's intact       Lungs:     Clear to auscultation bilaterally, respirations unlabored  Heart:    Regular rate and rhythm  Neurologic:   Awake, alert, oriented x 3. No apparent focal neurological           defect.      UC Treatments / Results  Labs (all labs ordered are listed, but only abnormal results are displayed) Labs Reviewed - No data to display  EKG   Radiology No results found.  Procedures Procedures (including critical care time)  Medications Ordered in UC Medications - No data to display  Initial Impression / Assessment and Plan / UC Course  I have reviewed the triage vital signs and the nursing notes.  Pertinent labs & imaging results that were available during my care of the patient were reviewed by me and considered in my medical decision making (see chart for details).    Acute rhinosinusitis , viral with associated facial pressure and sinus headache.Given the viral nature of symptoms, advised antibiotics are not warranted.  Symptomatic treatment per discharge medication orders/discharge instructions.  Work note provided return as needed. Final Clinical Impressions(s) / UC Diagnoses   Final diagnoses:  Acute rhinosinusitis  Facial pressure  Sinus headache     Discharge Instructions      Treating viral rhinosinusitis with prednisone 10 mg once daily for total of 5 days.  Take with food food to prevent abdominal upset.  Atrovent nasal spray you may use 2 sprays in each nares up to 3 times daily as needed for nasal symptoms. As discussed recommend taking over-the-counter antihistamine as well such as Zyrtec or Xyzal to prevent  recurrence of nasal symptoms and to help manage outdoor allergens.  Drink plenty of  water.  I have attached your work note to your discharge instructions.    ED Prescriptions     Medication Sig Dispense Auth. Provider   predniSONE (DELTASONE) 10 MG tablet Take 1 tablet (10 mg total) by mouth daily with breakfast for 5 days. 5 tablet Bing Neighbors, FNP   ipratropium (ATROVENT) 0.03 % nasal spray Place 2 sprays into both nostrils 3 (three) times daily as needed (Nasal symptoms and post nasal drainage). 30 mL Bing Neighbors, FNP      PDMP not reviewed this encounter.   Bing Neighbors, [place] 06/05/22 [phone]

## 2022-06-24 ENCOUNTER — Ambulatory Visit: Payer: BC Managed Care – PPO | Admitting: Endocrinology

## 2022-06-28 ENCOUNTER — Ambulatory Visit: Payer: BC Managed Care – PPO | Admitting: Internal Medicine

## 2022-07-08 ENCOUNTER — Ambulatory Visit (INDEPENDENT_AMBULATORY_CARE_PROVIDER_SITE_OTHER): Payer: BC Managed Care – PPO | Admitting: Family Medicine

## 2022-07-08 ENCOUNTER — Encounter: Payer: Self-pay | Admitting: Family Medicine

## 2022-07-08 ENCOUNTER — Telehealth: Payer: Self-pay | Admitting: Family Medicine

## 2022-07-08 ENCOUNTER — Ambulatory Visit
Admission: EM | Admit: 2022-07-08 | Discharge: 2022-07-08 | Discharge status: Against Medical Advice | Payer: BC Managed Care – PPO

## 2022-07-08 DIAGNOSIS — J209 Acute bronchitis, unspecified: Secondary | ICD-10-CM

## 2022-07-08 MED ORDER — PREDNISONE 10 MG PO TABS: ORAL_TABLET | ORAL | 0 refills | Status: DC

## 2022-07-08 MED ORDER — BENZONATATE 200 MG PO CAPS: 200.0000 mg | ORAL_CAPSULE | Freq: Three times a day (TID) | ORAL | 1 refills | Status: DC | PRN

## 2022-07-08 NOTE — Progress Notes (Signed)
Subjective:    Patient ID: Cassidy [name], female    DOB: [birth year], 36 y.o.   MRN: [id number]  HPI Pt presents for uri symptoms  35yo pt of Dr Fay Records Readings from Last 3 Encounters:  07/08/22 184 lb (83.5 kg)  04/12/22 186 lb 12.8 oz (84.7 kg)  04/02/22 184 lb (83.5 kg)   35.64 kg/m   Symptoms for 2 weeks  Not getting better  Hoarse voice    Cough- is fairly dry  Hears a little rattle -took some mucinex  Congestion  - nasal is clear   Burning feeling in chest   No wheezing  Not really tight   Is prone to asthma sympt when she gets a cold  Otc Day/nyquil  Ipatropium ns   No fever   Did not do a covid test   Patient Active Problem List   Diagnosis Date Noted   Acute bronchitis 07/08/2022   Snoring 03/21/2021   Hypothyroidism 07/11/2019   Dysplasia of cervix, low grade (CIN 1)    High risk HPV infection    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   POS HIGH RISK HPV   HSV infection    Hypertension    Hypothyroidism    Migraine    Pneumonia 2013   Pregnancy induced hypertension    UTI (urinary tract infection)    Vaginal Pap smear, abnormal    Past Surgical History:  Procedure Laterality Date   BREAST BIOPSY     CESAREAN SECTION  last 2018   x2   COLPOSCOPY  2005   DILATATION & CURETTAGE/HYSTEROSCOPY WITH MYOSURE N/A 10/19/2019   Procedure: DILATATION & CURETTAGE/HYSTEROSCOPY WITH MYOSURE VAGINAL ABLATION OF ENDOMETRIOSIS;  Surgeon: Princess Bruins, MD;  Location: [facility];  Service: Gynecology;  Laterality: N/A;  request 7:30am OR time in [place] Gyn block requests 30 minutes OR time   thyroid ablation  2017   TUBAL LIGATION     Social History   Tobacco Use   Smoking status: Never   Smokeless tobacco: Never  Vaping Use   Vaping Use: Never used  Substance Use Topics   Alcohol use: No   Drug use: No   Family  History  Problem Relation Age of Onset   Hypertension Father    Hypothyroidism Father    Hypertension Mother    Diabetes Mother    Hypothyroidism Mother    Allergies  Allergen Reactions   Latex Rash    swellling when touches   Current Outpatient Medications on File Prior to Visit  Medication Sig Dispense Refill   amLODipine (NORVASC) 5 MG tablet TAKE 1 TABLET(5 MG) BY MOUTH DAILY 180 tablet 3   ipratropium (ATROVENT) 0.03 % nasal spray Place 2 sprays into both nostrils 3 (three) times daily as needed (Nasal symptoms and post nasal drainage). 30 mL 0   levothyroxine (SYNTHROID) 50 MCG tablet Take 1 tablet (50 mcg total) by mouth daily. 90 tablet 3   Norethindrone Acetate-Ethinyl Estrad-FE (LOESTRIN 24 FE) 1-20 MG-MCG(24) tablet Take 1 tablet by mouth daily. May use continuously. 84 tablet 4   No current facility-administered medications on file prior to visit.    Review of Systems  Constitutional:  Positive for appetite change and fatigue. Negative for fever.  HENT:  Positive for congestion, postnasal drip, rhinorrhea, sneezing and voice change. Negative for ear pain, sinus pressure, sore  throat and trouble swallowing.   Eyes:  Negative for pain and discharge.  Respiratory:  Positive for cough. Negative for shortness of breath, wheezing and stridor.   Cardiovascular:  Negative for chest pain.  Gastrointestinal:  Negative for diarrhea, nausea and vomiting.  Genitourinary:  Negative for frequency, hematuria and urgency.  Musculoskeletal:  Negative for arthralgias and myalgias.  Skin:  Negative for rash.  Neurological:  Positive for headaches. Negative for dizziness, weakness and light-headedness.       Had sinus headache early on Improved now   Psychiatric/Behavioral:  Negative for confusion and dysphoric mood.        Objective:   Physical Exam Constitutional:      General: She is not in acute distress.    Appearance: She is well-developed. She is obese. She is not  ill-appearing or diaphoretic.  HENT:     Head: Normocephalic and atraumatic.     Comments: No facial or sinus swelling    Right Ear: Tympanic membrane and ear canal normal.     Left Ear: Tympanic membrane and ear canal normal.     Mouth/Throat:     Mouth: Mucous membranes are moist.     Pharynx: Oropharynx is clear. No oropharyngeal exudate or posterior oropharyngeal erythema.     Comments: Scant clear pnd Eyes:     General:        Right eye: No discharge.        Left eye: No discharge.     Conjunctiva/sclera: Conjunctivae normal.     Pupils: Pupils are equal, round, and reactive to light.  Cardiovascular:     Rate and Rhythm: Regular rhythm. Tachycardia present.     Heart sounds: Normal heart sounds.  Pulmonary:     Effort: Pulmonary effort is normal. No respiratory distress.     Breath sounds: No stridor. Rhonchi present. No wheezing or rales.     Comments: Mild rhonchi on forced exp - lower lung fields  Musculoskeletal:     Cervical back: Neck supple.  Lymphadenopathy:     Cervical: No cervical adenopathy.  Skin:    General: Skin is warm and dry.  Neurological:     Mental Status: She is alert.  Psychiatric:        Mood and Affect: Mood normal.           Assessment & Plan:   Problem List Items Addressed This Visit       Respiratory   Acute bronchitis    With 2 weeks of uri, suspect viral Dry cough and hoarseness  Disc symptom care Px prednisone 20 mg daily for 5 d (rev side eff) Fluids/rest Tessalon for cough  Rest voice when able ER precautions noted Update if not starting to improve in a week or if worsening   Note for missing work today

## 2022-07-08 NOTE — Telephone Encounter (Signed)
Pt called again to ask for an OV Pt refused every appointment offered. [facility] was called and there was no availability for the time Pt wanted. Pt stated she would call [facility] since it is closer to her house or go to the ED.

## 2022-07-08 NOTE — Telephone Encounter (Signed)
Pt callback and decline an appt for  this afternoon and pt was offered an appt with dr Martinique for 07-09-2022 8 am .

## 2022-07-08 NOTE — Patient Instructions (Addendum)
Drink fluids Rest your voice when you can   Take the prednisone as directed 20 mg daily for 5 days for bronchitis   Try the tessalon for cough  You can continue otc medicines also if needed   Delsym may be helpful

## 2022-07-08 NOTE — Assessment & Plan Note (Signed)
With 2 weeks of uri, suspect viral Dry cough and hoarseness  Disc symptom care Px prednisone 20 mg daily for 5 d (rev side eff) Fluids/rest Tessalon for cough  Rest voice when able ER precautions noted Update if not starting to improve in a week or if worsening   Note for missing work today

## 2022-07-08 NOTE — Telephone Encounter (Signed)
Connected with Pt via Triage Nurse (Amy) Pt was waiting on the other line. TN stated Pt has burning in her chest, cough & congestion x 2 wks.  TN stated Pt is refusing to go to the ER. Pt wants an OV - MD is out of the office today. TN was informed that NP had 2 available appts this afternoon. Pt hung up before we could offer her an appt. Called Pt back directly - Pt would not answer. Left message for Pt to call back to schedule.

## 2022-07-22 ENCOUNTER — Ambulatory Visit: Payer: BC Managed Care – PPO | Admitting: Family Medicine

## 2022-07-22 ENCOUNTER — Encounter: Payer: Self-pay | Admitting: Family Medicine

## 2022-07-22 VITALS — BP 124/64 | HR 87 | Temp 97.9°F | Resp 16 | Ht 60.25 in | Wt 186.2 lb

## 2022-07-22 DIAGNOSIS — R053 Chronic cough: Secondary | ICD-10-CM

## 2022-07-22 DIAGNOSIS — Z0289 Encounter for other administrative examinations: Secondary | ICD-10-CM

## 2022-07-22 DIAGNOSIS — I10 Essential (primary) hypertension: Secondary | ICD-10-CM | POA: Insufficient documentation

## 2022-07-22 MED ORDER — ALBUTEROL SULFATE HFA 108 (90 BASE) MCG/ACT IN AERS: 2.0000 | INHALATION_SPRAY | Freq: Four times a day (QID) | RESPIRATORY_TRACT | 2 refills | Status: DC | PRN

## 2022-07-22 MED ORDER — MONTELUKAST SODIUM 10 MG PO TABS: 10.0000 mg | ORAL_TABLET | Freq: Every day | ORAL | 3 refills | Status: DC

## 2022-07-22 NOTE — Progress Notes (Signed)
Patient ID: Cassidy [name], female    DOB: 1986-01-08, 36 y.o.   MRN: [id number]  This visit was conducted in person.  BP 124/64   Pulse 87   Temp 97.9 F (36.6 C)   Resp 16   Ht 5' 0.25" (1.53 m)   Wt 186 lb 4 oz (84.5 kg)   LMP 07/04/2022   SpO2 99%   BMI 36.07 kg/m    CC:  Chief Complaint  Patient presents with   Cough    Feel stuff come up but will not come up X 1 month    Subjective:   HPI: Cassidy [name] is a 36 y.o. female patient of Dr. Volanda Napoleon with history of hypertension allergic rhinitis presenting on 07/22/2022 for Cough (Feel stuff come up but will not come up X 1 month)   Reviewed chart in detail with recent office visits and urgent care appointments She was diagnosed on August 29 with  viral sinusitis and treated with Prednisone 10  mg taper and ipratropium nasal spray.  Symptoms resolved.   Symptoms returned  end September.  Appointment on July 08, 2022 with Dr. Glori Bickers.  Diagnosed with acute bronchitis and treated with prednisone and benzonatate. At that time had congestion, ST, cough, no fever.  Today she reports she feels chest congestion...  but dry cough. No fever, No ST, She feels better overall but just chest cough  No myalgia, no SO, no wheeze.  No ear pain, no face pain.  Non-smoker, no personal history of chronic lung disease.  Relevant past medical, surgical, family and social history reviewed and updated as indicated. Interim medical history since our last visit reviewed. Allergies and medications reviewed and updated. Outpatient Medications Prior to Visit  Medication Sig Dispense Refill   amLODipine (NORVASC) 5 MG tablet TAKE 1 TABLET(5 MG) BY MOUTH DAILY 180 tablet 3   benzonatate (TESSALON) 200 MG capsule Take 1 capsule (200 mg total) by mouth 3 (three) times daily as needed. Do not bite pill 30 capsule 1   ipratropium (ATROVENT) 0.03 % nasal spray Place 2 sprays into both nostrils 3 (three) times daily as needed (Nasal  symptoms and post nasal drainage). 30 mL 0   levothyroxine (SYNTHROID) 50 MCG tablet Take 1 tablet (50 mcg total) by mouth daily. 90 tablet 3   Norethindrone Acetate-Ethinyl Estrad-FE (LOESTRIN 24 FE) 1-20 MG-MCG(24) tablet Take 1 tablet by mouth daily. May use continuously. 84 tablet 4   predniSONE (DELTASONE) 10 MG tablet Take two pills by mouth daily for 5 days 10 tablet 0   No facility-administered medications prior to visit.     Per HPI unless specifically indicated in ROS section below Review of Systems  Constitutional:  Negative for fatigue and fever.  HENT:  Positive for congestion.   Eyes:  Negative for pain.  Respiratory:  Positive for cough. Negative for shortness of breath.   Cardiovascular:  Negative for chest pain, palpitations and leg swelling.  Gastrointestinal:  Negative for abdominal pain.  Genitourinary:  Negative for dysuria and vaginal bleeding.  Musculoskeletal:  Negative for back pain.  Neurological:  Negative for syncope, light-headedness and headaches.  Psychiatric/Behavioral:  Negative for dysphoric mood.    Objective:  BP 124/64   Pulse 87   Temp 97.9 F (36.6 C)   Resp 16   Ht 5' 0.25" (1.53 m)   Wt 186 lb 4 oz (84.5 kg)   LMP 07/04/2022   SpO2 99%   BMI 36.07 kg/m  Wt Readings from Last 3 Encounters:  07/22/22 186 lb 4 oz (84.5 kg)  07/08/22 184 lb (83.5 kg)  04/12/22 186 lb 12.8 oz (84.7 kg)      Physical Exam Constitutional:      General: She is not in acute distress.    Appearance: Normal appearance. She is well-developed. She is not ill-appearing or toxic-appearing.  HENT:     Head: Normocephalic.     Right Ear: Hearing, ear canal and external ear normal. A middle ear effusion is present. Tympanic membrane is not erythematous, retracted or bulging.     Left Ear: Hearing, ear canal and external ear normal. A middle ear effusion is present. Tympanic membrane is not erythematous, retracted or bulging.     Nose: Mucosal edema and congestion  present. No rhinorrhea.     Right Turbinates: Swollen and pale.     Left Turbinates: Swollen and pale.     Right Sinus: No maxillary sinus tenderness or frontal sinus tenderness.     Left Sinus: No maxillary sinus tenderness or frontal sinus tenderness.     Mouth/Throat:     Pharynx: Uvula midline.  Eyes:     General: Lids are normal. Lids are everted, no foreign bodies appreciated.     Conjunctiva/sclera: Conjunctivae normal.     Pupils: Pupils are equal, round, and reactive to light.  Neck:     Thyroid: No thyroid mass or thyromegaly.     Vascular: No carotid bruit.     Trachea: Trachea normal.  Cardiovascular:     Rate and Rhythm: Normal rate and regular rhythm.     Pulses: Normal pulses.     Heart sounds: Normal heart sounds, S1 normal and S2 normal. No murmur heard.    No friction rub. No gallop.  Pulmonary:     Effort: Pulmonary effort is normal. No tachypnea or respiratory distress.     Breath sounds: Normal breath sounds. No decreased breath sounds, wheezing, rhonchi or rales.  Abdominal:     General: Bowel sounds are normal.     Palpations: Abdomen is soft.     Tenderness: There is no abdominal tenderness.  Musculoskeletal:     Cervical back: Normal range of motion and neck supple.  Skin:    General: Skin is warm and dry.     Findings: No rash.  Neurological:     Mental Status: She is alert.  Psychiatric:        Mood and Affect: Mood is not anxious or depressed.        Speech: Speech normal.        Behavior: Behavior normal. Behavior is cooperative.        Thought Content: Thought content normal.        Judgment: Judgment normal.       Results for orders placed or performed in visit on 04/02/22  Cytology - PAP( [place])  Result Value Ref Range   High risk HPV Negative    Adequacy      Satisfactory for evaluation; transformation zone component PRESENT.   Diagnosis (A)     - Atypical squamous cells of undetermined significance (ASC-US)   Comment Normal  Reference Range HPV - Negative      COVID 19 screen:  No recent travel or known exposure to COVID19 The patient denies respiratory symptoms of COVID 19 at this time. The importance of social distancing was discussed today.   Assessment and Plan Problem List Items Addressed This Visit  Encounter for completion of form with patient    Also completed form for patient to be cleared to partake in [facility] training.  Per Dr. Sherlon Handing past notes I saw no concern for mental health issues or physical disabilities that would interfere with this.      Persistent cough for 3 weeks or longer - Primary    No clear current sign of viral or bacterial infection.  Persisting cough most likely secondary to postinfectious bronchospasm as well as potentially persistent allergies. She has had 2 courses of prednisone recently so I do not think this is a good idea to repeat.  She will start Singulair at bedtime for possible allergy/reactive airway component.  She will consider restarting this yearly prior to allergies in the fall. She will continue ipratropium nasal spray.   Provided albuterol inhaler to use as needed for coughing fits. Rest and fluids. ER and return precautions provided.        Kerby Nora, MD

## 2022-07-22 NOTE — Assessment & Plan Note (Signed)
Also completed form for patient to be cleared to partake in [facility] training.  Per Dr. Lockie Pares past notes I saw no concern for mental health issues or physical disabilities that would interfere with this.

## 2022-07-22 NOTE — Assessment & Plan Note (Signed)
No clear current sign of viral or bacterial infection.  Persisting cough most likely secondary to postinfectious bronchospasm as well as potentially persistent allergies. She has had 2 courses of prednisone recently so I do not think this is a good idea to repeat.  She will start Singulair at bedtime for possible allergy/reactive airway component.  She will consider restarting this yearly prior to allergies in the fall. She will continue ipratropium nasal spray.   Provided albuterol inhaler to use as needed for coughing fits. Rest and fluids. ER and return precautions provided.

## 2022-08-25 ENCOUNTER — Telehealth: Payer: Self-pay | Admitting: Internal Medicine

## 2022-08-25 DIAGNOSIS — E039 Hypothyroidism, unspecified: Secondary | ICD-10-CM

## 2022-08-25 MED ORDER — LEVOTHYROXINE SODIUM 50 MCG PO TABS: 50.0000 ug | ORAL_TABLET | Freq: Every day | ORAL | 0 refills | Status: DC

## 2022-08-25 NOTE — Telephone Encounter (Signed)
Done

## 2022-08-25 NOTE — Telephone Encounter (Signed)
MEDICATION: Levothyroxine  PHARMACY:  [facility] on [address]  HAS THE PATIENT CONTACTED THEIR PHARMACY?  yes  IS THIS A 90 DAY SUPPLY : yes  IS PATIENT OUT OF MEDICATION: yes  IF NOT; HOW MUCH IS LEFT:   LAST APPOINTMENT DATE: 06/2022 (cancelled [facility])  NEXT APPOINTMENT DATE:@1 /26/2024  DO WE HAVE YOUR PERMISSION TO LEAVE A DETAILED MESSAGE?:yes   OTHER COMMENTS:    **Let patient know to contact pharmacy at the end of the day to make sure medication is ready. **  ** Please notify patient to allow 48-72 hours to process**  **Encourage patient to contact the pharmacy for refills or they can request refills through [facility]**

## 2022-10-29 ENCOUNTER — Ambulatory Visit: Payer: BC Managed Care – PPO | Admitting: Internal Medicine

## 2022-11-02 ENCOUNTER — Ambulatory Visit (INDEPENDENT_AMBULATORY_CARE_PROVIDER_SITE_OTHER): Payer: BC Managed Care – PPO | Admitting: Family

## 2022-11-02 ENCOUNTER — Encounter: Payer: Self-pay | Admitting: Family

## 2022-11-02 VITALS — BP 128/76 | HR 97 | Temp 98.4°F | Ht 61.0 in | Wt 178.2 lb

## 2022-11-02 DIAGNOSIS — J101 Influenza due to other identified influenza virus with other respiratory manifestations: Secondary | ICD-10-CM | POA: Diagnosis not present

## 2022-11-02 DIAGNOSIS — J3489 Other specified disorders of nose and nasal sinuses: Secondary | ICD-10-CM

## 2022-11-02 LAB — POCT INFLUENZA A/B
Influenza A, POC: NEGATIVE — AB
Influenza B, POC: POSITIVE — AB

## 2022-11-02 LAB — POC COVID19 BINAXNOW: SARS Coronavirus 2 Ag: NEGATIVE

## 2022-11-02 MED ORDER — OSELTAMIVIR PHOSPHATE 75 MG PO CAPS: 75.0000 mg | ORAL_CAPSULE | Freq: Two times a day (BID) | ORAL | 0 refills | Status: DC

## 2022-11-02 NOTE — Patient Instructions (Signed)
Start tamiflu Most common side effects are nausea, vomiting, diarrhea.  Flu may be able to be spread to others 1 day prior to symptom onset and up to 5 to 7 days.  Please remain in quarantine until you are 24 hours without fever and WITHOUT medication such as Tylenol or ibuprofen.  You may return to work/school once you are fever free per above for 24 hours and all symptoms nearly resolved.  It is reasonable to take further precautions especially if you are around young children, or anyone that is immunocompromised, including wearing a mask, informing  those around you, and extending quarantine.   If you develop shortness of breath, chest pain, palpitations, worsening fever, or inability to stay hydrated, please call 911 or report to the nearest emergency room.  Influenza, Adult Influenza is also called "the flu." It is an infection in the lungs, nose, and throat (respiratory tract). It spreads easily from person to person (is contagious). The flu causes symptoms that are like a cold, along with high fever and body aches. What are the causes? This condition is caused by the influenza virus. You can get the virus by: Breathing in droplets that are in the air after a person infected with the flu coughed or sneezed. Touching something that has the virus on it and then touching your mouth, nose, or eyes. What increases the risk? Certain things may make you more likely to get the flu. These include: Not washing your hands often. Having close contact with many people during cold and flu season. Touching your mouth, eyes, or nose without first washing your hands. Not getting a flu shot every year. You may have a higher risk for the flu, and serious problems, such as a lung infection (pneumonia), if you: Are older than 65. Are pregnant. Have a weakened disease-fighting system (immune system) because of a disease or because you are taking certain medicines. Have a long-term (chronic) condition, such  as: Heart, kidney, or lung disease. Diabetes. Asthma. Have a liver disorder. Are very overweight (morbidly obese). Have anemia. What are the signs or symptoms? Symptoms usually begin suddenly and last 4-14 days. They may include: Fever and chills. Headaches, body aches, or muscle aches. Sore throat. Cough. Runny or stuffy (congested) nose. Feeling discomfort in your chest. Not wanting to eat as much as normal. Feeling weak or tired. Feeling dizzy. Feeling sick to your stomach or throwing up. How is this treated? If the flu is found early, you can be treated with antiviral medicine. This can help to reduce how bad the illness is and how long it lasts. This may be given by mouth or through an IV tube. Taking care of yourself at home can help your symptoms get better. Your doctor may want you to: Take over-the-counter medicines. Drink plenty of fluids. The flu often goes away on its own. If you have very bad symptoms or other problems, you may be treated in a hospital. Follow these instructions at home:   Activity Rest as needed. Get plenty of sleep. Stay home from work or school as told by your doctor. Do not leave home until you do not have a fever for 24 hours without taking medicine. Leave home only to go to your doctor. Eating and drinking Take an ORS (oral rehydration solution). This is a drink that is sold at pharmacies and stores. Drink enough fluid to keep your pee pale yellow. Drink clear fluids in small amounts as you are able. Clear fluids include: Water.  Ice chips. Fruit juice mixed with water. Low-calorie sports drinks. Eat bland foods that are easy to digest. Eat small amounts as you are able. These foods include: Bananas. Applesauce. Rice. Lean meats. Toast. Crackers. Do not eat or drink: Fluids that have a lot of sugar or caffeine. Alcohol. Spicy or fatty foods. General instructions Take over-the-counter and prescription medicines only as told by your  doctor. Use a cool mist humidifier to add moisture to the air in your home. This can make it easier for you to breathe. When using a cool mist humidifier, clean it daily. Empty water and replace with clean water. Cover your mouth and nose when you cough or sneeze. Wash your hands with soap and water often and for at least 20 seconds. This is also important after you cough or sneeze. If you cannot use soap and water, use alcohol-based hand sanitizer. Keep all follow-up visits. How is this prevented?  Get a flu shot every year. You may get the flu shot in late summer, fall, or winter. Ask your doctor when you should get your flu shot. Avoid contact with people who are sick during fall and winter. This is cold and flu season. Contact a doctor if: You get new symptoms. You have: Chest pain. Watery poop (diarrhea). A fever. Your cough gets worse. You start to have more mucus. You feel sick to your stomach. You throw up. Get help right away if you: Have shortness of breath. Have trouble breathing. Have skin or nails that turn a bluish color. Have very bad pain or stiffness in your neck. Get a sudden headache. Get sudden pain in your face or ear. Cannot eat or drink without throwing up. These symptoms may represent a serious problem that is an emergency. Get medical help right away. Call your local emergency services (911 in the [place] Do not wait to see if the symptoms will go away. Do not drive yourself to the hospital. Summary Influenza is also called "the flu." It is an infection in the lungs, nose, and throat. It spreads easily from person to person. Take over-the-counter and prescription medicines only as told by your doctor. Getting a flu shot every year is the best way to not get the flu. This information is not intended to replace advice given to you by your health care provider. Make sure you discuss any questions you have with your health care provider. Document Revised:  05/09/2020 Document Reviewed: 05/09/2020 Elsevier Patient Education  [place]. Oseltamivir Capsules What is this medication? OSELTAMIVIR (os el TAM i vir) prevents and treats infections caused by the flu virus (influenza). It works by slowing the spread of the flu virus in your body and reducing how long your symptoms last. It will not treat colds or infections caused by bacteria or other viruses. It will not replace the annual flu vaccine. This medicine may be used for other purposes; ask your health care provider or pharmacist if you have questions. COMMON BRAND NAME(S): Tamiflu What should I tell my care team before I take this medication? They need to know if you have any of the following conditions: Difficulty swallowing Kidney disease An unusual or allergic reaction to oseltamivir, other medications, foods, dyes, or preservatives Pregnant or trying to get pregnant Breast-feeding How should I use this medication? Take this medication by mouth with water. Take it as directed on the prescription label at the same time every day. You can take it with or without food. If it upsets your  stomach, take it with food. Take all of this medication unless your care team tells you to stop it early. Keep taking it even if you think you are better. When taking whole capsules: Do not cut, crush or chew this medication. Swallow the capsules whole. When mixing capsule contents with liquid: Open the capsule and mix the contents in a small bowl with a small amount of a sweetened liquid such as chocolate syrup, corn syrup, caramel topping, or light brown sugar (dissolved in water). Stir the mixture and take the dose right away after mixing. Talk to your care team about the use of this medication in children. While it may be prescribed for selected conditions, precautions do apply. Overdosage: If you think you have taken too much of this medicine contact a poison control center or emergency room at  once. NOTE: This medicine is only for you. Do not share this medicine with others. What if I miss a dose? If you miss a dose, take it as soon as you remember. If it is almost time for your next dose (within 2 hours), take only that dose. Do not take double or extra doses. What may interact with this medication? Intranasal influenza vaccine This list may not describe all possible interactions. Give your health care provider a list of all the medicines, herbs, non-prescription drugs, or dietary supplements you use. Also tell them if you smoke, drink alcohol, or use illegal drugs. Some items may interact with your medicine. What should I watch for while using this medication? Visit your care team for regular checks on your progress. Tell your care team if your symptoms do not start to get better or if they get worse. If you have the flu, you may be at an increased risk of developing seizures, confusion, or abnormal behavior. This occurs early in the illness, and more frequently in children and teens. These events are not common, but may result in accidental injury to the patient. Families and caregivers of patients should watch for signs of unusual behavior and contact a care team right away if the patient shows signs of unusual behavior. To treat the flu, start taking this medication within 2 days of getting flu symptoms. This medication is not a substitute for the flu shot. Talk to your care team each year about an annual flu shot. What side effects may I notice from receiving this medication? Side effects that you should report to your care team as soon as possible: Allergic reactions--skin rash, itching, hives, swelling of the face, lips, tongue, or throat Confusion Hallucinations Redness, blistering, peeling, or loosening of the skin, including inside the mouth Seizures Tremors or shaking Trouble speaking Unusual changes in behavior Side effects that usually do not require medical attention  (report to your care team if they continue or are bothersome): Headache Nausea Vomiting This list may not describe all possible side effects. Call your doctor for medical advice about side effects. You may report side effects to FDA at 1-800-FDA-1088. Where should I keep my medication? Keep out of the reach of children and pets. Store at room temperature between 15 and 30 degrees C (59 and 86 degrees F). Throw away any unused medication after the expiration date. NOTE: This sheet is a summary. It may not cover all possible information. If you have questions about this medicine, talk to your doctor, pharmacist, or health care provider.  2022 Elsevier/Gold Standard (2021-06-09 00:00:00)

## 2022-11-02 NOTE — Assessment & Plan Note (Signed)
Afebrile.  No acute respiratory distress . patient within timeframe to start Tamiflu.  Patient will start Tamiflu and let me know how she is doing

## 2022-11-02 NOTE — Progress Notes (Signed)
Assessment & Plan:  Influenza B Assessment & Plan: Afebrile.  No acute respiratory distress . patient within timeframe to start Tamiflu.  Patient will start Tamiflu and let me know how she is doing  Orders: -     Oseltamivir Phosphate; Take 1 capsule (75 mg total) by mouth 2 (two) times daily.  Dispense: 10 capsule; Refill: 0  Stuffy and runny nose -     POCT Influenza A/B -     POC COVID-19 BinaxNow     Return precautions given.   Risks, benefits, and alternatives of the medications and treatment plan prescribed today were discussed, and patient expressed understanding.   Education regarding symptom management and diagnosis given to patient on AVS either electronically or printed.  No follow-ups on file.  Mable Paris, FNP  Subjective:    Patient ID: Cassidy [name], female    DOB: 1986-06-24, 37 y.o.   MRN: [id number]  CC: Cassidy [name] is a 37 y.o. female who presents today for an acute visit.    HPI: Complains of sinus congestion, ear pain.  Nasal congestion is clear.  Duration 1 day.  No fever, chills, shortness of breath, cough.  Both of her children had flu this past week.    History of hypertension, OSA No history of smoking or lung disease She is not currently using albuterol inhaler Allergies: Latex Current Outpatient Medications on File Prior to Visit  Medication Sig Dispense Refill   albuterol (VENTOLIN HFA) 108 (90 Base) MCG/ACT inhaler Inhale 2 puffs into the lungs every 6 (six) hours as needed for wheezing or shortness of breath. 8 g 2   amLODipine (NORVASC) 5 MG tablet TAKE 1 TABLET(5 MG) BY MOUTH DAILY 180 tablet 3   ipratropium (ATROVENT) 0.03 % nasal spray Place 2 sprays into both nostrils 3 (three) times daily as needed (Nasal symptoms and post nasal drainage). 30 mL 0   levothyroxine (SYNTHROID) 50 MCG tablet Take 1 tablet (50 mcg total) by mouth daily. 90 tablet 0   montelukast (SINGULAIR) 10 MG tablet Take 1 tablet (10 mg total) by  mouth at bedtime. 30 tablet 3   Norethindrone Acetate-Ethinyl Estrad-FE (LOESTRIN 24 FE) 1-20 MG-MCG(24) tablet Take 1 tablet by mouth daily. May use continuously. 84 tablet 4   No current facility-administered medications on file prior to visit.    Review of Systems  Constitutional:  Negative for chills and fever.  HENT:  Positive for congestion and ear pain. Negative for postnasal drip and sinus pressure.   Respiratory:  Negative for cough.   Cardiovascular:  Negative for chest pain and palpitations.  Gastrointestinal:  Negative for nausea and vomiting.      Objective:    BP 128/76   Pulse 97   Temp 98.4 F (36.9 C) (Oral)   Ht 5\' 1"  (1.549 m)   Wt 178 lb 3.2 oz (80.8 kg)   LMP  (LMP Unknown)   SpO2 98%   BMI 33.67 kg/m   BP Readings from Last 3 Encounters:  11/02/22 128/76  07/22/22 124/64  07/08/22 128/78   Wt Readings from Last 3 Encounters:  11/02/22 178 lb 3.2 oz (80.8 kg)  07/22/22 186 lb 4 oz (84.5 kg)  07/08/22 184 lb (83.5 kg)    Physical Exam Vitals reviewed.  Constitutional:      Appearance: She is well-developed.  HENT:     Head: Normocephalic and atraumatic.     Right Ear: Hearing, tympanic membrane, ear canal and external ear normal. No  decreased hearing noted. No drainage, swelling or tenderness. No middle ear effusion. No foreign body. Tympanic membrane is not erythematous or bulging.     Left Ear: Hearing, tympanic membrane, ear canal and external ear normal. No decreased hearing noted. No drainage, swelling or tenderness.  No middle ear effusion. No foreign body. Tympanic membrane is not erythematous or bulging.     Nose: Nose normal. No rhinorrhea.     Right Sinus: No maxillary sinus tenderness or frontal sinus tenderness.     Left Sinus: No maxillary sinus tenderness or frontal sinus tenderness.     Mouth/Throat:     Pharynx: Uvula midline. No oropharyngeal exudate or posterior oropharyngeal erythema.     Tonsils: No tonsillar abscesses.   Eyes:     Conjunctiva/sclera: Conjunctivae normal.  Cardiovascular:     Rate and Rhythm: Regular rhythm.     Pulses: Normal pulses.     Heart sounds: Normal heart sounds.  Pulmonary:     Effort: Pulmonary effort is normal.     Breath sounds: Normal breath sounds. No wheezing, rhonchi or rales.  Lymphadenopathy:     Head:     Right side of head: No submental, submandibular, tonsillar, preauricular, posterior auricular or occipital adenopathy.     Left side of head: No submental, submandibular, tonsillar, preauricular, posterior auricular or occipital adenopathy.     Cervical: No cervical adenopathy.  Skin:    General: Skin is warm and dry.  Neurological:     Mental Status: She is alert.  Psychiatric:        Speech: Speech normal.        Behavior: Behavior normal.        Thought Content: Thought content normal.

## 2022-11-05 ENCOUNTER — Emergency Department (HOSPITAL_COMMUNITY)
Admission: EM | Admit: 2022-11-05 | Discharge: 2022-11-05 | Disposition: A | Discharge status: Home / Self Care | Payer: BC Managed Care – PPO | Attending: Student | Admitting: Student

## 2022-11-05 ENCOUNTER — Encounter (HOSPITAL_COMMUNITY): Payer: Self-pay

## 2022-11-05 ENCOUNTER — Other Ambulatory Visit: Payer: Self-pay

## 2022-11-05 DIAGNOSIS — Z79899 Other long term (current) drug therapy: Secondary | ICD-10-CM | POA: Diagnosis not present

## 2022-11-05 DIAGNOSIS — J111 Influenza due to unidentified influenza virus with other respiratory manifestations: Secondary | ICD-10-CM

## 2022-11-05 DIAGNOSIS — E039 Hypothyroidism, unspecified: Secondary | ICD-10-CM | POA: Insufficient documentation

## 2022-11-05 DIAGNOSIS — M79672 Pain in left foot: Secondary | ICD-10-CM | POA: Insufficient documentation

## 2022-11-05 DIAGNOSIS — M79671 Pain in right foot: Secondary | ICD-10-CM | POA: Diagnosis present

## 2022-11-05 DIAGNOSIS — Z91040 Latex allergy status: Secondary | ICD-10-CM | POA: Diagnosis not present

## 2022-11-05 DIAGNOSIS — I10 Essential (primary) hypertension: Secondary | ICD-10-CM | POA: Insufficient documentation

## 2022-11-05 DIAGNOSIS — M7989 Other specified soft tissue disorders: Secondary | ICD-10-CM | POA: Insufficient documentation

## 2022-11-05 DIAGNOSIS — M79604 Pain in right leg: Secondary | ICD-10-CM

## 2022-11-05 LAB — CBC WITH DIFFERENTIAL/PLATELET
Abs Immature Granulocytes: 0.01 10*3/uL (ref 0.00–0.07)
Basophils Absolute: 0 10*3/uL (ref 0.0–0.1)
Basophils Relative: 1 %
Eosinophils Absolute: 0.1 10*3/uL (ref 0.0–0.5)
Eosinophils Relative: 2 %
HCT: 36.8 % (ref 36.0–46.0)
Hemoglobin: 11.4 g/dL — ABNORMAL LOW (ref 12.0–15.0)
Immature Granulocytes: 0 %
Lymphocytes Relative: 39 %
Lymphs Abs: 2.2 10*3/uL (ref 0.7–4.0)
MCH: 25.3 pg — ABNORMAL LOW (ref 26.0–34.0)
MCHC: 31 g/dL (ref 30.0–36.0)
MCV: 81.8 fL (ref 80.0–100.0)
Monocytes Absolute: 0.4 10*3/uL (ref 0.1–1.0)
Monocytes Relative: 7 %
Neutro Abs: 2.9 10*3/uL (ref 1.7–7.7)
Neutrophils Relative %: 51 %
Platelets: 382 10*3/uL (ref 150–400)
RBC: 4.5 MIL/uL (ref 3.87–5.11)
RDW: 15.5 % (ref 11.5–15.5)
WBC: 5.7 10*3/uL (ref 4.0–10.5)
nRBC: 0 % (ref 0.0–0.2)

## 2022-11-05 LAB — CK: Total CK: 197 U/L (ref 38–234)

## 2022-11-05 LAB — COMPREHENSIVE METABOLIC PANEL
ALT: 15 U/L (ref 0–44)
AST: 17 U/L (ref 15–41)
Albumin: 3.5 g/dL (ref 3.5–5.0)
Alkaline Phosphatase: 39 U/L (ref 38–126)
Anion gap: 9 (ref 5–15)
BUN: 8 mg/dL (ref 6–20)
CO2: 23 mmol/L (ref 22–32)
Calcium: 8.8 mg/dL — ABNORMAL LOW (ref 8.9–10.3)
Chloride: 106 mmol/L (ref 98–111)
Creatinine, Ser: 0.8 mg/dL (ref 0.44–1.00)
GFR, Estimated: >60 mL/min (ref >60)
Glucose, Bld: 115 mg/dL — ABNORMAL HIGH (ref 70–99)
Potassium: 3.9 mmol/L (ref 3.5–5.1)
Sodium: 138 mmol/L (ref 135–145)
Total Bilirubin: 0.2 mg/dL — ABNORMAL LOW (ref 0.3–1.2)
Total Protein: 7.4 g/dL (ref 6.5–8.1)

## 2022-11-05 MED ORDER — KETOROLAC TROMETHAMINE 15 MG/ML IJ SOLN
15.0000 mg | Freq: Once | INTRAMUSCULAR | Status: AC
  Administered 2022-11-05: 15 mg via INTRAMUSCULAR
  Filled 2022-11-05: qty 1

## 2022-11-05 NOTE — ED Triage Notes (Signed)
Pt presents with bilateral leg pain and swelling to feet that started today. Pt states she is flu +. She is concerned because her son is admitted with the flu and developed rhabdomyolysis.

## 2022-11-05 NOTE — ED Provider Notes (Signed)
[facility] Provider Note  CSN: [id number] Arrival date & time: 11/05/22 1835  Chief Complaint(s) Leg Pain  HPI Dwayne Begay is a 37 y.o. female who presents emergency department for evaluation of bilateral heel pain and leg swelling in the setting of a known flu infection.  Patient has been with her son who is currently admitted to the hospital for rhabdomyolysis in the setting of an influenza infection and patient has been sitting in a recliner for the last 36 hours.  She states that she is noticed swelling of the heels and tenderness to palpation.  States that she is primarily concerned that she also has rhabdomyolysis and would like to be checked today.  Denies change in the color of her urine, chest pain, shortness of breath, abdominal pain, back pain or other systemic symptoms.   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   POS HIGH RISK HPV   HSV infection    Hypertension    Hypothyroidism    Migraine    Pneumonia 2013   Pregnancy induced hypertension    UTI (urinary tract infection)    Vaginal Pap smear, abnormal    Patient Active Problem List   Diagnosis Date Noted   Influenza B 11/02/2022   Hypertension 07/22/2022   Persistent cough for 3 weeks or longer 07/22/2022   Encounter for completion of form with patient 07/22/2022   Acute bronchitis 07/08/2022   OSA on CPAP 03/21/2021   Hypothyroidism 07/11/2019   Dysplasia of cervix, low grade (CIN 1)    High risk HPV infection    Home Medication(s) Prior to Admission medications   Medication Sig Start Date End Date Taking? Authorizing Provider  albuterol (VENTOLIN HFA) 108 (90 Base) MCG/ACT inhaler Inhale 2 puffs into the lungs every 6 (six) hours as needed for wheezing or shortness of breath. 07/22/22   Bedsole, Amy E, MD  amLODipine (NORVASC) 5 MG tablet  TAKE 1 TABLET(5 MG) BY MOUTH DAILY 04/12/22   Billie Ruddy, MD  ipratropium (ATROVENT) 0.03 % nasal spray Place 2 sprays into both nostrils 3 (three) times daily as needed (Nasal symptoms and post nasal drainage). 06/01/22   Scot Jun, NP  levothyroxine (SYNTHROID) 50 MCG tablet Take 1 tablet (50 mcg total) by mouth daily. 08/25/22   Philemon Kingdom, MD  montelukast (SINGULAIR) 10 MG tablet Take 1 tablet (10 mg total) by mouth at bedtime. 07/22/22   Bedsole, Amy E, MD  Norethindrone Acetate-Ethinyl Estrad-FE (LOESTRIN 24 FE) 1-20 MG-MCG(24) tablet Take 1 tablet by mouth daily. May use continuously. 04/02/22   Princess Bruins, MD  oseltamivir (TAMIFLU) 75 MG capsule Take 1 capsule (75 mg total) by mouth 2 (two) times daily. 11/02/22   Burnard Hawthorne, FNP  Past Surgical History Past Surgical History:  Procedure Laterality Date   BREAST BIOPSY     CESAREAN SECTION  last 2018   x2   COLPOSCOPY  2005   DILATATION & CURETTAGE/HYSTEROSCOPY WITH MYOSURE N/A 10/19/2019   Procedure: DILATATION & CURETTAGE/HYSTEROSCOPY WITH MYOSURE VAGINAL ABLATION OF ENDOMETRIOSIS;  Surgeon: Princess Bruins, MD;  Location: [facility];  Service: Gynecology;  Laterality: N/A;  request 7:30am OR time in [place] [facility] block requests 30 minutes OR time   thyroid ablation  2017   TUBAL LIGATION     Family History Family History  Problem Relation Age of Onset   Hypertension Father    Hypothyroidism Father    Hypertension Mother    Diabetes Mother    Hypothyroidism Mother     Social History Social History   Tobacco Use   Smoking status: Never   Smokeless tobacco: Never  Vaping Use   Vaping Use: Never used  Substance Use Topics   Alcohol use: No   Drug use: No   Allergies Latex  Review of Systems Review of Systems  Cardiovascular:  Positive  for leg swelling.  Musculoskeletal:  Positive for myalgias.    Physical Exam Vital Signs  I have reviewed the triage vital signs BP (!) 145/95 (BP Location: Right Arm)   Pulse (!) 103   Temp 98.1 F (36.7 C)   Resp 16   Ht 5\' 1"  (1.549 m)   Wt 80.7 kg   LMP 10/17/2022   SpO2 98%   BMI 33.63 kg/m   Physical Exam Vitals and nursing note reviewed.  Constitutional:      General: She is not in acute distress.    Appearance: She is well-developed.  HENT:     Head: Normocephalic and atraumatic.  Eyes:     Conjunctiva/sclera: Conjunctivae normal.  Cardiovascular:     Rate and Rhythm: Normal rate and regular rhythm.     Heart sounds: No murmur heard. Pulmonary:     Effort: Pulmonary effort is normal. No respiratory distress.     Breath sounds: Normal breath sounds.  Abdominal:     Palpations: Abdomen is soft.     Tenderness: There is no abdominal tenderness.  Musculoskeletal:        General: Tenderness present. No swelling.     Cervical back: Neck supple.  Skin:    General: Skin is warm and dry.     Capillary Refill: Capillary refill takes less than 2 seconds.  Neurological:     Mental Status: She is alert.  Psychiatric:        Mood and Affect: Mood normal.     ED Results and Treatments Labs (all labs ordered are listed, but only abnormal results are displayed) Labs Reviewed  CBC WITH DIFFERENTIAL/PLATELET - Abnormal; Notable for the following components:      Result Value   Hemoglobin 11.4 (*)    MCH 25.3 (*)    All other components within normal limits  COMPREHENSIVE METABOLIC PANEL  CK  Radiology No results found.  Pertinent labs & imaging results that were available during my care of the patient were reviewed by me and considered in my medical decision making (see MDM for details).  Medications Ordered in ED Medications - No data to  display                                                                                                                                   Procedures Procedures  (including critical care time)  Medical Decision Making / ED Course   This patient presents to the ED for concern of leg pain, concern for rhabdo, this involves an extensive number of treatment options, and is a complaint that carries with it a high risk of complications and morbidity.  The differential diagnosis includes influenza, myalgias, rhabdomyolysis, gravity dependent edema  MDM: Patient seen emergency room for evaluation of bilateral heel pain and concern for rhabdomyolysis.  Physical exam with tenderness today heels bilaterally but no significant pitting edema.  Laboratory evaluation with an anemia at 11.4 but is otherwise unremarkable.  CK is normal.  Patient received a dose of Toradol for myalgias and at this time does not meet inpatient criteria for admission.  Her presentation is consistent with her known influenza and underlying myalgias.  Patient then discharged with outpatient follow-up.   Additional history obtained:  -External records from outside source obtained and reviewed including: Chart review including previous notes, labs, imaging, consultation notes   Lab Tests: -I ordered, reviewed, and interpreted labs.   The pertinent results include:   Labs Reviewed  CBC WITH DIFFERENTIAL/PLATELET - Abnormal; Notable for the following components:      Result Value   Hemoglobin 11.4 (*)    MCH 25.3 (*)    All other components within normal limits  COMPREHENSIVE METABOLIC PANEL  CK       Medicines ordered and prescription drug management: No orders of the defined types were placed in this encounter.   -I have reviewed the patients home medicines and have made adjustments as needed  Critical interventions none    Cardiac Monitoring: The patient was maintained on a cardiac monitor.  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

## 2022-11-05 NOTE — ED Provider Triage Note (Signed)
Emergency Medicine Provider Triage Evaluation Note  Cassidy [name] , a 37 y.o. female  was evaluated in triage.  Pt complains of aching bilateral legs and feet.  She was diagnosed with flu 3 days ago and is on Tamiflu.  She states that she came in because her son is upstairs being treated for rhabdomyolysis with the flu and is concerned she could also have rhabdomyolysis and wanted to be evaluated for this.  Denies chest pain shortness of breath fevers chills or other symptoms.    Review of Systems  Positive: myalgia Negative: fever  Physical Exam  BP (!) 145/95 (BP Location: Right Arm)   Pulse (!) 103   Temp 98.1 F (36.7 C)   Resp 16   LMP  (LMP Unknown)   SpO2 98%  Gen:   Awake, no distress   Resp:  Normal effort  MSK:   Moves extremities without difficulty  Other:  Normal gait  Medical Decision Making  Medically screening exam initiated at 6:45 PM.  Appropriate orders placed.  Carey Lafon was informed that the remainder of the evaluation will be completed by another provider, this initial triage assessment does not replace that evaluation, and the importance of remaining in the ED until their evaluation is complete.     Gwenevere Abbot, [place] 11/05/22 [phone]

## 2022-11-29 ENCOUNTER — Other Ambulatory Visit: Payer: Self-pay | Admitting: Internal Medicine

## 2022-11-29 DIAGNOSIS — E039 Hypothyroidism, unspecified: Secondary | ICD-10-CM

## 2023-01-14 ENCOUNTER — Ambulatory Visit (INDEPENDENT_AMBULATORY_CARE_PROVIDER_SITE_OTHER): Payer: BC Managed Care – PPO | Admitting: Family Medicine

## 2023-01-14 ENCOUNTER — Encounter: Payer: Self-pay | Admitting: Family Medicine

## 2023-01-14 VITALS — BP 146/90 | HR 90 | Temp 98.4°F | Ht 61.0 in | Wt 180.2 lb

## 2023-01-14 DIAGNOSIS — E039 Hypothyroidism, unspecified: Secondary | ICD-10-CM | POA: Diagnosis not present

## 2023-01-14 DIAGNOSIS — Z0000 Encounter for general adult medical examination without abnormal findings: Secondary | ICD-10-CM | POA: Diagnosis not present

## 2023-01-14 DIAGNOSIS — Z6834 Body mass index (BMI) 34.0-34.9, adult: Secondary | ICD-10-CM

## 2023-01-14 DIAGNOSIS — E669 Obesity, unspecified: Secondary | ICD-10-CM

## 2023-01-14 DIAGNOSIS — Z1159 Encounter for screening for other viral diseases: Secondary | ICD-10-CM

## 2023-01-14 DIAGNOSIS — I10 Essential (primary) hypertension: Secondary | ICD-10-CM | POA: Diagnosis not present

## 2023-01-14 LAB — CBC WITH DIFFERENTIAL/PLATELET
Basophils Absolute: 0 10*3/uL (ref 0.0–0.1)
Basophils Relative: 0.7 % (ref 0.0–3.0)
Eosinophils Absolute: 0.1 10*3/uL (ref 0.0–0.7)
Eosinophils Relative: 1.3 % (ref 0.0–5.0)
HCT: 36.9 % (ref 36.0–46.0)
Hemoglobin: 11.9 g/dL — ABNORMAL LOW (ref 12.0–15.0)
Lymphocytes Relative: 37.6 % (ref 12.0–46.0)
Lymphs Abs: 2.1 10*3/uL (ref 0.7–4.0)
MCHC: 32.2 g/dL (ref 30.0–36.0)
MCV: 80.4 fl (ref 78.0–100.0)
Monocytes Absolute: 0.4 10*3/uL (ref 0.1–1.0)
Monocytes Relative: 7.1 % (ref 3.0–12.0)
Neutro Abs: 3 10*3/uL (ref 1.4–7.7)
Neutrophils Relative %: 53.3 % (ref 43.0–77.0)
Platelets: 413 10*3/uL — ABNORMAL HIGH (ref 150.0–400.0)
RBC: 4.59 Mil/uL (ref 3.87–5.11)
RDW: 15.8 % — ABNORMAL HIGH (ref 11.5–15.5)
WBC: 5.7 10*3/uL (ref 4.0–10.5)

## 2023-01-14 LAB — COMPREHENSIVE METABOLIC PANEL
ALT: 14 U/L (ref 0–35)
AST: 16 U/L (ref 0–37)
Albumin: 4.2 g/dL (ref 3.5–5.2)
Alkaline Phosphatase: 48 U/L (ref 39–117)
BUN: 8 mg/dL (ref 6–23)
CO2: 26 mEq/L (ref 19–32)
Calcium: 9.4 mg/dL (ref 8.4–10.5)
Chloride: 104 mEq/L (ref 96–112)
Creatinine, Ser: 0.94 mg/dL (ref 0.40–1.20)
GFR: 77.06 mL/min (ref >60.00)
Glucose, Bld: 74 mg/dL (ref 70–99)
Potassium: 3.9 mEq/L (ref 3.5–5.1)
Sodium: 137 mEq/L (ref 135–145)
Total Bilirubin: 0.2 mg/dL (ref 0.2–1.2)
Total Protein: 8.2 g/dL (ref 6.0–8.3)

## 2023-01-14 LAB — LIPID PANEL
Cholesterol: 152 mg/dL (ref 0–200)
HDL: 42.6 mg/dL (ref >39.00)
LDL Cholesterol: 95 mg/dL (ref 0–99)
NonHDL: 109.85
Total CHOL/HDL Ratio: 4
Triglycerides: 72 mg/dL (ref 0.0–149.0)
VLDL: 14.4 mg/dL (ref 0.0–40.0)

## 2023-01-14 LAB — TSH: TSH: 3.6 u[IU]/mL (ref 0.35–5.50)

## 2023-01-14 LAB — HEMOGLOBIN A1C: Hgb A1c MFr Bld: 6 % (ref 4.6–6.5)

## 2023-01-14 MED ORDER — LEVOTHYROXINE SODIUM 50 MCG PO TABS: 50.0000 ug | ORAL_TABLET | Freq: Every day | ORAL | 3 refills | Status: DC

## 2023-01-14 MED ORDER — AMLODIPINE BESYLATE 5 MG PO TABS: ORAL_TABLET | ORAL | 3 refills | Status: DC

## 2023-01-14 NOTE — Progress Notes (Signed)
Established Patient Office Visit   Subjective  Patient ID: Cassidy [name], female    DOB: 08/21/1986  Age: 37 y.o. MRN: [id number]  Chief Complaint  Patient presents with   Annual Exam    Patient is a 37 year old female with pmh sig for HTN, hypothyroidism, seasonal allergies who is seen for CPE.  Patient states she is doing well overall.  A little tired this morning she cannot sleep well.  Patient also notes BP likely elevated 2/2 forgetting dose of medication last night and not wearing CPAP.  BP typically well-controlled at home and during office visits.  Patient denies headaches.  Needs refills on Synthroid as only has a few pills left.    Patient Active Problem List   Diagnosis Date Noted   Influenza B 11/02/2022   Hypertension 07/22/2022   Persistent cough for 3 weeks or longer 07/22/2022   Encounter for completion of form with patient 07/22/2022   Acute bronchitis 07/08/2022   OSA on CPAP 03/21/2021   Hypothyroidism 07/11/2019   Dysplasia of cervix, low grade (CIN 1)    High risk HPV infection    Social History   Tobacco Use   Smoking status: Never   Smokeless tobacco: Never  Vaping Use   Vaping Use: Never used  Substance Use Topics   Alcohol use: No   Drug use: No   Family History  Problem Relation Age of Onset   Hypertension Father    Hypothyroidism Father    Hypertension Mother    Diabetes Mother    Hypothyroidism Mother    Allergies  Allergen Reactions   Latex Rash    swellling when touches    ROS Negative unless stated above    Objective:     BP (!) 146/90 (BP Location: Left Arm, Patient Position: Sitting, Cuff Size: Large)   Pulse 90   Temp 98.4 F (36.9 C) (Oral)   Ht 5\' 1"  (1.549 m)   Wt 180 lb 3.2 oz (81.7 kg)   SpO2 99%   BMI 34.05 kg/m  BP Readings from Last 3 Encounters:  01/14/23 (!) 146/90  11/05/22 (!) 156/108  11/02/22 128/76   Wt Readings from Last 3 Encounters:  01/14/23 180 lb 3.2 oz (81.7 kg)  11/05/22 178 lb  (80.7 kg)  11/02/22 178 lb 3.2 oz (80.8 kg)      Physical Exam Constitutional:      Appearance: Normal appearance.  HENT:     Head: Normocephalic and atraumatic.     Right Ear: Tympanic membrane, ear canal and external ear normal.     Left Ear: Tympanic membrane, ear canal and external ear normal.     Nose: Nose normal.     Mouth/Throat:     Mouth: Mucous membranes are moist.     Pharynx: No oropharyngeal exudate or posterior oropharyngeal erythema.  Eyes:     General: No scleral icterus.    Extraocular Movements: Extraocular movements intact.     Conjunctiva/sclera: Conjunctivae normal.     Pupils: Pupils are equal, round, and reactive to light.  Neck:     Thyroid: No thyromegaly.  Cardiovascular:     Rate and Rhythm: Normal rate and regular rhythm.     Pulses: Normal pulses.     Heart sounds: Normal heart sounds. No murmur heard.    No friction rub.  Pulmonary:     Effort: Pulmonary effort is normal.     Breath sounds: Normal breath sounds. No wheezing, rhonchi or rales.  Abdominal:  General: Bowel sounds are normal.     Palpations: Abdomen is soft.     Tenderness: There is no abdominal tenderness.  Musculoskeletal:        General: No deformity. Normal range of motion.  Lymphadenopathy:     Cervical: No cervical adenopathy.  Skin:    General: Skin is warm and dry.     Findings: No lesion.  Neurological:     General: No focal deficit present.     Mental Status: She is alert and oriented to person, place, and time.  Psychiatric:        Mood and Affect: Mood normal.        Thought Content: Thought content normal.      No results found for any visits on 01/14/23.    Assessment & Plan:  Well adult exam -Anticipatory guidance given including wearing seatbelts, smoke detectors in the home, increasing physical activity, increasing p.o. intake of water and vegetables. -labs -Pap up-to-date done 04/09/2022 with OB/GYN -Immunizations reviewed -Mammogram and  colonoscopy not yet indicated 2/2 age. -Next CPE in 1 year  Essential hypertension -Elevated at this visit, typically better controlled -Patient advised on importance of taking medication using CPAP nightly -Continue Norvasc 5 mg daily -Continue lifestyle modifications -Continue monitoring BP at home.  Notify clinic for elevations consistently greater than 140/90 -     Comprehensive metabolic panel -     Lipid panel -     TSH -     Hemoglobin A1c -     amLODIPine Besylate; TAKE 1 TABLET(5 MG) BY MOUTH DAILY  Dispense: 90 tablet; Refill: 3  Class 1 obesity with serious comorbidity and body mass index (BMI) of 34.0 to 34.9 in adult, unspecified obesity type -Body mass index is 34.05 kg/m. -Lifestyle modification strongly encouraged -     Comprehensive metabolic panel -     Lipid panel -     Hemoglobin A1c -     CBC with Differential/Platelet  Acquired hypothyroidism -Continue Synthroid 50 mcg every morning. -Adjust dose if needed based on results -     TSH -     Levothyroxine Sodium; Take 1 tablet (50 mcg total) by mouth daily.  Dispense: 90 tablet; Refill: 3  Encounter for hepatitis C screening test for low risk patient -     Hepatitis C antibody  Return in about 4 months (around 05/16/2023) for HTN.   Deeann Saint, MD

## 2023-01-15 LAB — HEPATITIS C ANTIBODY: Hepatitis C Ab: NONREACTIVE

## 2023-02-01 ENCOUNTER — Encounter: Payer: Self-pay | Admitting: Nurse Practitioner

## 2023-02-01 ENCOUNTER — Ambulatory Visit: Payer: BC Managed Care – PPO | Admitting: Nurse Practitioner

## 2023-02-01 VITALS — BP 134/84 | HR 115 | Temp 97.8°F | Resp 16 | Ht 61.0 in | Wt 179.2 lb

## 2023-02-01 DIAGNOSIS — J0140 Acute pansinusitis, unspecified: Secondary | ICD-10-CM | POA: Insufficient documentation

## 2023-02-01 MED ORDER — PREDNISONE 10 MG PO TABS
10.0000 mg | ORAL_TABLET | Freq: Every day | ORAL | 0 refills | Status: DC
Start: 2023-02-01 — End: 2023-04-12

## 2023-02-01 MED ORDER — AZITHROMYCIN 250 MG PO TABS: ORAL_TABLET | ORAL | 0 refills | Status: AC

## 2023-02-01 MED ORDER — FLUTICASONE PROPIONATE 50 MCG/ACT NA SUSP
2.0000 | Freq: Every day | NASAL | 1 refills | Status: DC
Start: 2023-02-01 — End: 2024-01-09

## 2023-02-01 NOTE — Assessment & Plan Note (Signed)
Started on Zpack, prednisone and Flonase nasal spray. Advised to increase hydration and use steam and humidifier. Handout provided for supplemental information.

## 2023-02-01 NOTE — Patient Instructions (Signed)
Rx sent to the pharmacy. Increase fluid intake. Steam and humidifier,  Sinus Infection, Adult A sinus infection, also called sinusitis, is inflammation of your sinuses. Sinuses are hollow spaces in the bones around your face. Your sinuses are located: Around your eyes. In the middle of your forehead. Behind your nose. In your cheekbones. Mucus normally drains out of your sinuses. When your nasal tissues become inflamed or swollen, mucus can become trapped or blocked. This allows bacteria, viruses, and fungi to grow, which leads to infection. Most infections of the sinuses are caused by a virus. A sinus infection can develop quickly. It can last for up to 4 weeks (acute) or for more than 12 weeks (chronic). A sinus infection often develops after a cold. What are the causes? This condition is caused by anything that creates swelling in the sinuses or stops mucus from draining. This includes: Allergies. Asthma. Infection from bacteria or viruses. Deformities or blockages in your nose or sinuses. Abnormal growths in the nose (nasal polyps). Pollutants, such as chemicals or irritants in the air. Infection from fungi. This is rare. What increases the risk? You are more likely to develop this condition if you: Have a weak body defense system (immune system). Do a lot of swimming or diving. Overuse nasal sprays. Smoke. What are the signs or symptoms? The main symptoms of this condition are pain and a feeling of pressure around the affected sinuses. Other symptoms include: Stuffy nose or congestion that makes it difficult to breathe through your nose. Thick yellow or greenish drainage from your nose. Tenderness, swelling, and warmth over the affected sinuses. A cough that may get worse at night. Decreased sense of smell and taste. Extra mucus that collects in the throat or the back of the nose (postnasal drip) causing a sore throat or bad breath. Tiredness (fatigue). Fever. How is this  diagnosed? This condition is diagnosed based on: Your symptoms. Your medical history. A physical exam. Tests to find out if your condition is acute or chronic. This may include: Checking your nose for nasal polyps. Viewing your sinuses using a device that has a light (endoscope). Testing for allergies or bacteria. Imaging tests, such as an MRI or CT scan. In rare cases, a bone biopsy may be done to rule out more serious types of fungal sinus disease. How is this treated? Treatment for a sinus infection depends on the cause and whether your condition is chronic or acute. If caused by a virus, your symptoms should go away on their own within 10 days. You may be given medicines to relieve symptoms. They include: Medicines that shrink swollen nasal passages (decongestants). A spray that eases inflammation of the nostrils (topical intranasal corticosteroids). Rinses that help get rid of thick mucus in your nose (nasal saline washes). Medicines that treat allergies (antihistamines). Over-the-counter pain relievers. If caused by bacteria, your health care provider may recommend waiting to see if your symptoms improve. Most bacterial infections will get better without antibiotic medicine. You may be given antibiotics if you have: A severe infection. A weak immune system. If caused by narrow nasal passages or nasal polyps, surgery may be needed. Follow these instructions at home: Medicines Take, use, or apply over-the-counter and prescription medicines only as told by your health care provider. These may include nasal sprays. If you were prescribed an antibiotic medicine, take it as told by your health care provider. Do not stop taking the antibiotic even if you start to feel better. Hydrate and humidify  Drink  enough fluid to keep your urine pale yellow. Staying hydrated will help to thin your mucus. Use a cool mist humidifier to keep the humidity level in your home above 50%. Inhale steam for  10-15 minutes, 3-4 times a day, or as told by your health care provider. You can do this in the bathroom while a hot shower is running. Limit your exposure to cool or dry air. Rest Rest as much as possible. Sleep with your head raised (elevated). Make sure you get enough sleep each night. General instructions  Apply a warm, moist washcloth to your face 3-4 times a day or as told by your health care provider. This will help with discomfort. Use nasal saline washes as often as told by your health care provider. Wash your hands often with soap and water to reduce your exposure to germs. If soap and water are not available, use hand sanitizer. Do not smoke. Avoid being around people who are smoking (secondhand smoke). Keep all follow-up visits. This is important. Contact a health care provider if: You have a fever. Your symptoms get worse. Your symptoms do not improve within 10 days. Get help right away if: You have a severe headache. You have persistent vomiting. You have severe pain or swelling around your face or eyes. You have vision problems. You develop confusion. Your neck is stiff. You have trouble breathing. These symptoms may be an emergency. Get help right away. Call 911. Do not wait to see if the symptoms will go away. Do not drive yourself to the hospital. Summary A sinus infection is soreness and inflammation of your sinuses. Sinuses are hollow spaces in the bones around your face. This condition is caused by nasal tissues that become inflamed or swollen. The swelling traps or blocks the flow of mucus. This allows bacteria, viruses, and fungi to grow, which leads to infection. If you were prescribed an antibiotic medicine, take it as told by your health care provider. Do not stop taking the antibiotic even if you start to feel better. Keep all follow-up visits. This is important. This information is not intended to replace advice given to you by your health care provider.  Make sure you discuss any questions you have with your health care provider. Document Revised: 08/25/2021 Document Reviewed: 08/25/2021 Elsevier Patient Education  2023 [employer].

## 2023-02-01 NOTE — Progress Notes (Signed)
Established Patient Office Visit  Subjective:  Patient ID: Cassidy [name], female    DOB: [birth year]  Age: 37 y.o. MRN: [id number]  CC:  Chief Complaint  Patient presents with   Cough   Nasal Congestion    HPI  Cassidy [name] presents for:  Sinus Problem This is a new problem. The current episode started in the past 7 days. The problem is unchanged. There has been no fever. Associated symptoms include congestion, coughing, headaches and sinus pressure. Pertinent negatives include no chills or sore throat. The treatment provided mild relief.    She does not want amoxicillin because it gives are fungal infection.    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   POS HIGH RISK HPV   HSV infection    Hypertension    Hypothyroidism    Migraine    Pneumonia 2013   Pregnancy induced hypertension    UTI (urinary tract infection)    Vaginal Pap smear, abnormal     Past Surgical History:  Procedure Laterality Date   BREAST BIOPSY     CESAREAN SECTION  last 2018   x2   COLPOSCOPY  2005   DILATATION & CURETTAGE/HYSTEROSCOPY WITH MYOSURE N/A 10/19/2019   Procedure: DILATATION & CURETTAGE/HYSTEROSCOPY WITH MYOSURE VAGINAL ABLATION OF ENDOMETRIOSIS;  Surgeon: Genia Del, MD;  Location: [facility];  Service: Gynecology;  Laterality: N/A;  request 7:30am OR time in [place] [facility] block requests 30 minutes OR time   thyroid ablation  2017   TUBAL LIGATION      Family History  Problem Relation Age of Onset   Hypertension Father    Hypothyroidism Father    Hypertension Mother    Diabetes Mother    Hypothyroidism Mother     Social History   Socioeconomic History   Marital status: Married    Spouse name: Not on file   Number of children: 2   Years of education: Not on file   Highest education level: Not on file  Occupational History   Not on file   Tobacco Use   Smoking status: Never   Smokeless tobacco: Never  Vaping Use   Vaping Use: Never used  Substance and Sexual Activity   Alcohol use: No   Drug use: No   Sexual activity: Yes    Birth control/protection: Surgical, Pill    Comment: BTL,  Other Topics Concern   Not on file  Social History Narrative   Lives with husband and 2 kids.    Social Determinants of Health   Financial Resource Strain: Not on file  Food Insecurity: Not on file  Transportation Needs: Not on file  Physical Activity: Not on file  Stress: Not on file  Social Connections: Not on file  Intimate Partner Violence: Not on file     Outpatient Medications Prior to Visit  Medication Sig Dispense Refill   amLODipine (NORVASC) 5 MG tablet TAKE 1 TABLET(5 MG) BY MOUTH DAILY 90 tablet 3   levothyroxine (SYNTHROID) 50 MCG tablet Take 1 tablet (50 mcg total) by mouth daily. 90 tablet 3   Norethindrone Acetate-Ethinyl Estrad-FE (LOESTRIN 24 FE) 1-20 MG-MCG(24) tablet Take 1 tablet by mouth daily. May use continuously. 84 tablet 4   No facility-administered medications prior to visit.    Allergies  Allergen Reactions   Latex Rash    swellling when touches    ROS  Review of Systems  Constitutional:  Negative for chills.  HENT:  Positive for congestion and sinus pressure. Negative for sore throat.   Respiratory:  Positive for cough.   Neurological:  Positive for headaches.      Objective:    Physical Exam Constitutional:      Appearance: Normal appearance.  HENT:     Head: Normocephalic.     Right Ear: Tympanic membrane normal.     Left Ear: Tympanic membrane normal.     Nose:     Right Turbinates: Not swollen.     Left Turbinates: Swollen.     Right Sinus: Maxillary sinus tenderness and frontal sinus tenderness present.     Left Sinus: Maxillary sinus tenderness and frontal sinus tenderness present.     Mouth/Throat:     Pharynx: Posterior oropharyngeal erythema present.   Cardiovascular:     Rate and Rhythm: Normal rate and regular rhythm.     Pulses: Normal pulses.     Heart sounds: Normal heart sounds. No murmur heard. Pulmonary:     Effort: Pulmonary effort is normal.     Breath sounds: Normal breath sounds. No wheezing or rhonchi.  Neurological:     General: No focal deficit present.     Mental Status: She is alert and oriented to person, place, and time. Mental status is at baseline.  Psychiatric:        Mood and Affect: Mood normal.        Behavior: Behavior normal.        Thought Content: Thought content normal.        Judgment: Judgment normal.     BP 134/84   Pulse (!) 115   Temp 97.8 F (36.6 C)   Resp 16   Ht 5\' 1"  (1.549 m)   Wt 179 lb 3.2 oz (81.3 kg)   SpO2 99%   BMI 33.86 kg/m  Wt Readings from Last 3 Encounters:  02/01/23 179 lb 3.2 oz (81.3 kg)  01/14/23 180 lb 3.2 oz (81.7 kg)  11/05/22 178 lb (80.7 kg)     Health Maintenance  Topic Date Due   HIV Screening  Never done   HPV VACCINES (3 - Risk 3-dose series) 07/11/2006   COVID-19 Vaccine (3 - 2023-24 season) 06/04/2022   INFLUENZA VACCINE  05/05/2023   PAP SMEAR-Modifier  04/02/2025   DTaP/Tdap/Td (3 - Td or Tdap) 07/16/2027   Hepatitis C Screening  Completed       Topic Date Due   HPV VACCINES (3 - Risk 3-dose series) 07/11/2006    Lab Results  Component Value Date   TSH 3.60 01/14/2023   Lab Results  Component Value Date   WBC 5.7 01/14/2023   HGB 11.9 (L) 01/14/2023   HCT 36.9 01/14/2023   MCV 80.4 01/14/2023   PLT 413.0 (H) 01/14/2023   Lab Results  Component Value Date   NA 137 01/14/2023   K 3.9 01/14/2023   CO2 26 01/14/2023   GLUCOSE 74 01/14/2023   BUN 8 01/14/2023   CREATININE 0.94 01/14/2023   BILITOT 0.2 01/14/2023   ALKPHOS 48 01/14/2023   AST 16 01/14/2023   ALT 14 01/14/2023   PROT 8.2 01/14/2023   ALBUMIN 4.2 01/14/2023   CALCIUM 9.4 01/14/2023   ANIONGAP 9 11/05/2022   GFR 77.06 01/14/2023   Lab Results  Component  Value Date   CHOL 152 01/14/2023   Lab Results  Component Value Date   HDL 42.60 01/14/2023   Lab  Results  Component Value Date   LDLCALC 95 01/14/2023   Lab Results  Component Value Date   TRIG 72.0 01/14/2023   Lab Results  Component Value Date   CHOLHDL 4 01/14/2023   Lab Results  Component Value Date   HGBA1C 6.0 01/14/2023      Assessment & Plan:  Acute pansinusitis, recurrence not specified Assessment & Plan: Started on Zpack, prednisone and Flonase nasal spray. Advised to increase hydration and use steam and humidifier. Handout provided for supplemental information.    Other orders -     Azithromycin; Take 2 tablets on day 1, then 1 tablet daily on days 2 through 5  Dispense: 6 tablet; Refill: 0 -     predniSONE; Take 1 tablet (10 mg total) by mouth daily with breakfast.  Dispense: 5 tablet; Refill: 0 -     Fluticasone Propionate; Place 2 sprays into both nostrils daily.  Dispense: 16 g; Refill: 1    Follow-up: Return if symptoms worsen or fail to improve.   Kara Dies, NP

## 2023-04-12 ENCOUNTER — Ambulatory Visit (INDEPENDENT_AMBULATORY_CARE_PROVIDER_SITE_OTHER): Payer: BC Managed Care – PPO | Admitting: Obstetrics & Gynecology

## 2023-04-12 ENCOUNTER — Encounter: Payer: Self-pay | Admitting: Obstetrics & Gynecology

## 2023-04-12 VITALS — BP 124/78 | HR 89 | Ht 62.0 in | Wt 173.0 lb

## 2023-04-12 DIAGNOSIS — N945 Secondary dysmenorrhea: Secondary | ICD-10-CM

## 2023-04-12 DIAGNOSIS — Z01419 Encounter for gynecological examination (general) (routine) without abnormal findings: Secondary | ICD-10-CM | POA: Diagnosis not present

## 2023-04-12 DIAGNOSIS — Z9851 Tubal ligation status: Secondary | ICD-10-CM

## 2023-04-12 MED ORDER — NORETHIN ACE-ETH ESTRAD-FE 1-20 MG-MCG(24) PO TABS: 1.0000 | ORAL_TABLET | Freq: Every day | ORAL | 4 refills | Status: DC

## 2023-04-12 NOTE — Progress Notes (Signed)
Cassidy [name] 1985/11/20 [id number]   History:    37 y.o.  [id number] Married.  S/P TL.    RP:  Established patient presenting for annual gyn exam    HPI:  Well on the generic of LoEstrin 24 FE 1/20.  No BTB.  Had a HSC/Myosure excision of Polyp/D+C in 10/2019 with me.  S/P Tubal ligation.  No pelvic pain.  No pain with IC.  Remote H/O Mild Cervical Dysplasia, no H/O Cervical treatment.  Pap Neg/HPV HR Neg in 03/2022.  Repeat Pap at 3 years.  Breasts normal.  BMI decreased to 31.64.  Regular fitness activities.  Lower calorie/carb diet. Health labs with Fam MD. cHTN on Meds.  Gardasil series completed.     Past medical history,surgical history, family history and social history were all reviewed and documented in the EPIC chart.  Gynecologic History Patient's last menstrual period was 04/07/2023.  Obstetric History OB History  Gravida Para Term Preterm AB Living  3 1   1 1 2   SAB IAB Ectopic Multiple Live Births  1       2    # Outcome Date GA Lbr Len/2nd Weight Sex Delivery Anes PTL Lv  3 Preterm 04/30/14 [redacted]w[redacted]d    CS-LTranv        Complications: Pre-eclampsia  2 Gravida           1 SAB              ROS: A ROS was performed and pertinent positives and negatives are included in the history. GENERAL: No fevers or chills. HEENT: No change in vision, no earache, sore throat or sinus congestion. NECK: No pain or stiffness. CARDIOVASCULAR: No chest pain or pressure. No palpitations. PULMONARY: No shortness of breath, cough or wheeze. GASTROINTESTINAL: No abdominal pain, nausea, vomiting or diarrhea, melena or bright red blood per rectum. GENITOURINARY: No urinary frequency, urgency, hesitancy or dysuria. MUSCULOSKELETAL: No joint or muscle pain, no back pain, no recent trauma. DERMATOLOGIC: No rash, no itching, no lesions. ENDOCRINE: No polyuria, polydipsia, no heat or cold intolerance. No recent change in weight. HEMATOLOGICAL: No anemia or easy bruising or bleeding. NEUROLOGIC: No  headache, seizures, numbness, tingling or weakness. PSYCHIATRIC: No depression, no loss of interest in normal activity or change in sleep pattern.     Exam:   BP 124/78 (BP Location: Left Arm, Patient Position: Sitting, Cuff Size: Normal)   Pulse 89   Ht 5\' 2"  (1.575 m)   Wt 173 lb (78.5 kg)   LMP 04/07/2023   SpO2 100%   BMI 31.64 kg/m   Body mass index is 31.64 kg/m.  General appearance : Well developed well nourished female. No acute distress HEENT: Eyes: no retinal hemorrhage or exudates,  Neck supple, trachea midline, no carotid bruits, no thyroidmegaly Lungs: Clear to auscultation, no rhonchi or wheezes, or rib retractions  Heart: Regular rate and rhythm, no murmurs or gallops Breast:Examined in sitting and supine position were symmetrical in appearance, no palpable masses or tenderness,  no skin retraction, no nipple inversion, no nipple discharge, no skin discoloration, no axillary or supraclavicular lymphadenopathy Abdomen: no palpable masses or tenderness, no rebound or guarding Extremities: no edema or skin discoloration or tenderness  Pelvic: Vulva: Normal             Vagina: No gross lesions or discharge  Cervix: No gross lesions or discharge  Uterus  RV, normal size, shape and consistency, non-tender and mobile  Adnexa  Without masses or tenderness  Anus: Normal   Assessment/Plan:  37 y.o. female for annual exam   1. Well female exam with routine gynecological exam Well on the generic of LoEstrin 24 FE 1/20.  No BTB.  Had a HSC/Myosure excision of Polyp/D+C in 10/2019 with me.  S/P Tubal ligation.  No pelvic pain.  No pain with IC.  Remote H/O Mild Cervical Dysplasia, no H/O Cervical treatment.  Pap Neg/HPV HR Neg in 03/2022.  Repeat Pap at 3 years.  Breasts normal.  BMI decreased to 31.64.  Regular fitness activities.  Lower calorie/carb diet. Health labs with Fam MD. cHTN on Meds.  Gardasil series completed.   2. S/P tubal ligation  3. Secondary  dysmenorrhea Well on the generic of LoEstrin 24 FE 1/20.  No BTB. No CI to continue on BCPs, prescription sent to pharmacy.  Other orders - Norethindrone Acetate-Ethinyl Estrad-FE (LOESTRIN 24 FE) 1-20 MG-MCG(24) tablet; Take 1 tablet by mouth daily. May use continuously.   Genia Del MD, 8:33 AM

## 2023-10-06 ENCOUNTER — Other Ambulatory Visit: Payer: Self-pay

## 2023-10-06 DIAGNOSIS — N945 Secondary dysmenorrhea: Secondary | ICD-10-CM

## 2023-10-06 DIAGNOSIS — N920 Excessive and frequent menstruation with regular cycle: Secondary | ICD-10-CM

## 2023-10-06 NOTE — Telephone Encounter (Signed)
 Pt LVM in the med refill line inquiring about a BC refill. No pharmacy left in msg.

## 2023-10-07 NOTE — Telephone Encounter (Signed)
 Med refill request: Loestrin 24 FE Last AEX: 04/12/23 ML Next AEX: none Last MMG (if hormonal med) n/a Spoke to patient to check on prescription. RX was written 04/12/23 for #84 with 4 refills.  Patient states the pharmacy has only been filling #1 pack at a time and she will run out of medication before AEX.  Patient was informed I would contact the pharmacy for correction on RX.  Patient was instructed to call back if she had any problems getting her #3 packs at her next refill.  Pharmacy was contacted by phone and given RX information for Generic Loestrin 24 FE #84 with refills through 04/2024. Sent to provider for review.

## 2023-11-04 ENCOUNTER — Encounter: Payer: Self-pay | Admitting: Family Medicine

## 2023-11-04 ENCOUNTER — Ambulatory Visit: Payer: 59 | Admitting: Family Medicine

## 2023-11-04 VITALS — BP 130/90 | HR 113 | Temp 99.3°F | Ht 62.0 in | Wt 182.6 lb

## 2023-11-04 DIAGNOSIS — N6011 Diffuse cystic mastopathy of right breast: Secondary | ICD-10-CM | POA: Diagnosis not present

## 2023-11-04 DIAGNOSIS — N644 Mastodynia: Secondary | ICD-10-CM | POA: Diagnosis not present

## 2023-11-04 DIAGNOSIS — N6012 Diffuse cystic mastopathy of left breast: Secondary | ICD-10-CM | POA: Diagnosis not present

## 2023-11-04 NOTE — Progress Notes (Signed)
Established Patient Office Visit   Subjective  Patient ID: Cassidy [name], female    DOB: Jul 22, 1986  Age: 38 y.o. MRN: [id number]  Chief Complaint  Patient presents with   Breast Pain    Sore left breast top at the 10 mark, started 5 days ago     Patient is a 38 year old female seen for acute concern.  Patient endorses soreness in left breast x 5 days.  Patient may have noticed the soreness due to seatbelt pressing in area.  Patient notes small bump underneath skin which may have decreased in size.  LMP 1/22 or 23-1/25.  Denies nipple drainage, skin changes, erythema, edema, fever, increased caffeine intake, changes in medications or supplements.  Checking breast regularly.  Had mammogram due to history of right breast cyst that cause nipple drainage.  Patient notes family history of breast cancer in grandmother.  BP initially elevated.  Patient states she was reading a work-related email upon arrival to clinic.    Patient Active Problem List   Diagnosis Date Noted   Acute pansinusitis 02/01/2023   Influenza B 11/02/2022   Hypertension 07/22/2022   Persistent cough for 3 weeks or longer 07/22/2022   Encounter for completion of form with patient 07/22/2022   Acute bronchitis 07/08/2022   OSA on CPAP 03/21/2021   Hypothyroidism 07/11/2019   Dysplasia of cervix, low grade (CIN 1)    High risk HPV infection    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   POS HIGH RISK HPV   HSV infection    Hypertension    Hypothyroidism    Migraine    Pneumonia 2013   Pregnancy induced hypertension    UTI (urinary tract infection)    Vaginal Pap smear, abnormal    Past Surgical History:  Procedure Laterality Date   BREAST BIOPSY     CESAREAN SECTION  last 2018   x2   COLPOSCOPY  2005   DILATATION & CURETTAGE/HYSTEROSCOPY WITH MYOSURE N/A 10/19/2019   Procedure: DILATATION &  CURETTAGE/HYSTEROSCOPY WITH MYOSURE VAGINAL ABLATION OF ENDOMETRIOSIS;  Surgeon: Genia Del, MD;  Location: [facility] [place];  Service: Gynecology;  Laterality: N/A;  request 7:30am OR time in [place] Gyn block requests 30 minutes OR time   thyroid ablation  2017   TUBAL LIGATION     Social History   Tobacco Use   Smoking status: Never   Smokeless tobacco: Never  Vaping Use   Vaping status: Never Used  Substance Use Topics   Alcohol use: No   Drug use: No   Family History  Problem Relation Age of Onset   Hypertension Father    Hypothyroidism Father    Hypertension Mother    Diabetes Mother    Hypothyroidism Mother    Allergies  Allergen Reactions   Latex Rash    swellling when touches      ROS Negative unless stated above    Objective:     BP (!) 130/90 (BP Location: Left Arm, Patient Position: Sitting, Cuff Size: Normal)   Pulse (!) 113   Temp 99.3 F (37.4 C) (Oral)   Ht 5\' 2"  (1.575 m)   Wt 182 lb 9.6 oz (82.8 kg)   LMP 11/01/2023 (Exact Date)   SpO2 98%   BMI 33.40 kg/m  BP Readings from Last 3 Encounters:  11/04/23 (!) 130/90  04/12/23 124/78  02/01/23 134/84  Wt Readings from Last 3 Encounters:  11/04/23 182 lb 9.6 oz (82.8 kg)  04/12/23 173 lb (78.5 kg)  02/01/23 179 lb 3.2 oz (81.3 kg)      Physical Exam Constitutional:      Appearance: Normal appearance.  HENT:     Head: Normocephalic and atraumatic.     Mouth/Throat:     Mouth: Mucous membranes are moist.  Cardiovascular:     Rate and Rhythm: Normal rate.  Pulmonary:     Effort: Pulmonary effort is normal.  Chest:     Comments: Normal-appearing skin of bilateral breast.  No nipple inversion, erythema, drainage.  Scattered, mobile, round cyst of bilateral breast, 3-5 mm without TTP. Skin:    General: Skin is warm and dry.  Neurological:     Mental Status: She is alert and oriented to person, place, and time. Mental status is at baseline.      No results  found for any visits on 11/04/23.    Assessment & Plan:  Fibrocystic breast changes, bilateral  Breast tenderness  Patient with acute breast tenderness likely due to menses.  Reassurance provided.  Scattered fibrocystic breast changes noted bilaterally on exam.  No red flag symptoms.  Discussed regular self exams.  Advised cystic changes may also be accompanied by pain.  Discussed concerning symptoms.  Regular mammograms starting at age 72, sooner for worsening symptoms.  Return if symptoms worsen or fail to improve.   Deeann Saint, MD

## 2024-01-09 ENCOUNTER — Encounter: Payer: Self-pay | Admitting: Internal Medicine

## 2024-01-09 ENCOUNTER — Ambulatory Visit: Admitting: Internal Medicine

## 2024-01-09 VITALS — BP 124/88 | HR 111 | Temp 99.9°F | Ht 62.0 in | Wt 183.0 lb

## 2024-01-09 DIAGNOSIS — J0100 Acute maxillary sinusitis, unspecified: Secondary | ICD-10-CM | POA: Diagnosis not present

## 2024-01-09 MED ORDER — FLUCONAZOLE 150 MG PO TABS: 150.0000 mg | ORAL_TABLET | ORAL | 1 refills | Status: AC

## 2024-01-09 MED ORDER — AMOXICILLIN-POT CLAVULANATE 875-125 MG PO TABS: 1.0000 | ORAL_TABLET | Freq: Two times a day (BID) | ORAL | 0 refills | Status: AC

## 2024-01-09 NOTE — Progress Notes (Signed)
 Subjective:    Patient ID: Cassidy [name], female    DOB: [birth year], 38 y.o.   MRN: [id number]  HPI Here due to respiratory symptoms  Does generally get allergy symptoms in the spring--then secondary sinus infection Started 5-6 days ago Now with green/yellow drainage (post nasal drip) and coughing it up Didn't seem to have pollen symptoms Does use xyzal  No fever No SOB--just nasal stuffiness Some sore throat Right ear pain--and feels pressure when blowing her nose  No other Rx  Current Outpatient Medications on File Prior to Visit  Medication Sig Dispense Refill   amLODipine (NORVASC) 5 MG tablet TAKE 1 TABLET(5 MG) BY MOUTH DAILY 90 tablet 3   levothyroxine (SYNTHROID) 50 MCG tablet Take 1 tablet (50 mcg total) by mouth daily. 90 tablet 3   Norethindrone Acetate-Ethinyl Estrad-FE (LOESTRIN 24 FE) 1-20 MG-MCG(24) tablet Take 1 tablet by mouth daily. May use continuously. 84 tablet 4   No current facility-administered medications on file prior to visit.    Allergies  Allergen Reactions   Latex Rash    swellling when touches    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   POS HIGH RISK HPV   HSV infection    Hypertension    Hypothyroidism    Migraine    Pneumonia 2013   Pregnancy induced hypertension    UTI (urinary tract infection)    Vaginal Pap smear, abnormal     Past Surgical History:  Procedure Laterality Date   BREAST BIOPSY     CESAREAN SECTION  last 2018   x2   COLPOSCOPY  2005   DILATATION & CURETTAGE/HYSTEROSCOPY WITH MYOSURE N/A 10/19/2019   Procedure: DILATATION & CURETTAGE/HYSTEROSCOPY WITH MYOSURE VAGINAL ABLATION OF ENDOMETRIOSIS;  Surgeon: Genia Del, MD;  Location: [facility];  Service: Gynecology;  Laterality: N/A;  request 7:30am OR time in [place] Gyn block requests 30 minutes OR time   thyroid ablation  2017    TUBAL LIGATION      Family History  Problem Relation Age of Onset   Hypertension Father    Hypothyroidism Father    Hypertension Mother    Diabetes Mother    Hypothyroidism Mother     Social History   Socioeconomic History   Marital status: Married    Spouse name: Not on file   Number of children: 2   Years of education: Not on file   Highest education level: Master's degree (e.g., MA, MS, MEng, MEd, MSW, MBA)  Occupational History   Not on file  Tobacco Use   Smoking status: Never   Smokeless tobacco: Never  Vaping Use   Vaping status: Never Used  Substance and Sexual Activity   Alcohol use: No   Drug use: No   Sexual activity: Yes    Birth control/protection: Surgical, Pill    Comment: BTL,  Other Topics Concern   Not on file  Social History Narrative   Lives with husband and 2 kids.    Social Drivers of Corporate investment banker Strain: Low Risk  (11/03/2023)   Overall Financial Resource Strain (CARDIA)    Difficulty of Paying Living Expenses: Not very hard  Food Insecurity: No Food Insecurity (11/03/2023)   Hunger Vital Sign    Worried About Running Out of Food in the Last Year: Never true    Ran Out of Food in the Last  Year: Never true  Transportation Needs: No Transportation Needs (11/03/2023)   PRAPARE - Administrator, Civil Service (Medical): No    Lack of Transportation (Non-Medical): No  Physical Activity: Unknown (11/03/2023)   Exercise Vital Sign    Days of Exercise per Week: 0 days    Minutes of Exercise per Session: Not on file  Stress: No Stress Concern Present (11/03/2023)   [employer] of Occupational Health - Occupational Stress Questionnaire    Feeling of Stress : Only a little  Social Connections: Moderately Integrated (11/03/2023)   Social Connection and Isolation Panel [NHANES]    Frequency of Communication with Friends and Family: More than three times a week    Frequency of Social Gatherings with Friends and Family:  Once a week    Attends Religious Services: More than 4 times per year    Active Member of [employer] or Organizations: No    Attends Engineer, structural: Not on file    Marital Status: Married  Catering manager Violence: Not on file   Review of Systems No sig loss of smell or taste No N/V Eating okay     Objective:   Physical Exam Constitutional:      Appearance: Normal appearance.  HENT:     Head:     Comments: Mild maxillary tenderness    Mouth/Throat:     Comments: Slight pharyngeal injection Pulmonary:     Effort: Pulmonary effort is normal.     Breath sounds: Normal breath sounds. No wheezing or rales.  Musculoskeletal:     Cervical back: Neck supple.  Lymphadenopathy:     Cervical: No cervical adenopathy.  Neurological:     Mental Status: She is alert.            Assessment & Plan:

## 2024-01-09 NOTE — Assessment & Plan Note (Signed)
 Seems to have secondary bacterial infection Discussed starting allergy meds sooner in spring Augmentin 875 bid x 7 days Analgesics prn

## 2024-02-16 ENCOUNTER — Other Ambulatory Visit: Payer: Self-pay | Admitting: Family Medicine

## 2024-02-16 DIAGNOSIS — I10 Essential (primary) hypertension: Secondary | ICD-10-CM

## 2024-02-17 ENCOUNTER — Encounter: Payer: Self-pay | Admitting: Family Medicine

## 2024-02-17 ENCOUNTER — Ambulatory Visit (INDEPENDENT_AMBULATORY_CARE_PROVIDER_SITE_OTHER): Admitting: Family Medicine

## 2024-02-17 VITALS — BP 132/86 | HR 96 | Temp 98.7°F | Ht 61.81 in | Wt 184.0 lb

## 2024-02-17 DIAGNOSIS — E039 Hypothyroidism, unspecified: Secondary | ICD-10-CM | POA: Diagnosis not present

## 2024-02-17 DIAGNOSIS — I10 Essential (primary) hypertension: Secondary | ICD-10-CM

## 2024-02-17 DIAGNOSIS — Z0000 Encounter for general adult medical examination without abnormal findings: Secondary | ICD-10-CM | POA: Diagnosis not present

## 2024-02-17 LAB — COMPREHENSIVE METABOLIC PANEL WITH GFR
ALT: 18 U/L (ref 0–35)
AST: 18 U/L (ref 0–37)
Albumin: 4.2 g/dL (ref 3.5–5.2)
Alkaline Phosphatase: 40 U/L (ref 39–117)
BUN: 9 mg/dL (ref 6–23)
CO2: 26 meq/L (ref 19–32)
Calcium: 9.3 mg/dL (ref 8.4–10.5)
Chloride: 104 meq/L (ref 96–112)
Creatinine, Ser: 0.82 mg/dL (ref 0.40–1.20)
GFR: 90.09 mL/min (ref >60.00)
Glucose, Bld: 73 mg/dL (ref 70–99)
Potassium: 4 meq/L (ref 3.5–5.1)
Sodium: 137 meq/L (ref 135–145)
Total Bilirubin: 0.3 mg/dL (ref 0.2–1.2)
Total Protein: 8 g/dL (ref 6.0–8.3)

## 2024-02-17 LAB — LIPID PANEL
Cholesterol: 153 mg/dL (ref 0–200)
HDL: 40.6 mg/dL (ref >39.00)
LDL Cholesterol: 99 mg/dL (ref 0–99)
NonHDL: 112.76
Total CHOL/HDL Ratio: 4
Triglycerides: 67 mg/dL (ref 0.0–149.0)
VLDL: 13.4 mg/dL (ref 0.0–40.0)

## 2024-02-17 LAB — CBC WITH DIFFERENTIAL/PLATELET
Basophils Absolute: 0 10*3/uL (ref 0.0–0.1)
Basophils Relative: 0.6 % (ref 0.0–3.0)
Eosinophils Absolute: 0.1 10*3/uL (ref 0.0–0.7)
Eosinophils Relative: 1.3 % (ref 0.0–5.0)
HCT: 36.7 % (ref 36.0–46.0)
Hemoglobin: 11.8 g/dL — ABNORMAL LOW (ref 12.0–15.0)
Lymphocytes Relative: 36.5 % (ref 12.0–46.0)
Lymphs Abs: 2.2 10*3/uL (ref 0.7–4.0)
MCHC: 32.2 g/dL (ref 30.0–36.0)
MCV: 82.1 fl (ref 78.0–100.0)
Monocytes Absolute: 0.3 10*3/uL (ref 0.1–1.0)
Monocytes Relative: 5.4 % (ref 3.0–12.0)
Neutro Abs: 3.4 10*3/uL (ref 1.4–7.7)
Neutrophils Relative %: 56.2 % (ref 43.0–77.0)
Platelets: 355 10*3/uL (ref 150.0–400.0)
RBC: 4.47 Mil/uL (ref 3.87–5.11)
RDW: 14.5 % (ref 11.5–15.5)
WBC: 6.1 10*3/uL (ref 4.0–10.5)

## 2024-02-17 LAB — HEMOGLOBIN A1C: Hgb A1c MFr Bld: 5.8 % (ref 4.6–6.5)

## 2024-02-17 MED ORDER — LEVOTHYROXINE SODIUM 50 MCG PO TABS: 50.0000 ug | ORAL_TABLET | Freq: Every day | ORAL | 3 refills | Status: AC

## 2024-02-17 NOTE — Progress Notes (Signed)
 Established Patient Office Visit   Subjective  Patient ID: Cassidy [name], female    DOB: [birth year]  Age: 38 y.o. MRN: [id number]  Chief Complaint  Patient presents with   Annual Exam    Patient is a 38 year old female seen for CPE.  Patient is fasting.  Has no current complaints/concerns.  Followed by OB/GYN.  Has appointment scheduled for July.    Patient Active Problem List   Diagnosis Date Noted   Acute non-recurrent maxillary sinusitis 01/09/2024   Acute pansinusitis 02/01/2023   Influenza B 11/02/2022   Hypertension 07/22/2022   Persistent cough for 3 weeks or longer 07/22/2022   Encounter for completion of form with patient 07/22/2022   Acute bronchitis 07/08/2022   OSA on CPAP 03/21/2021   Hypothyroidism 07/11/2019   Dysplasia of cervix, low grade (CIN 1)    High risk HPV infection    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   POS HIGH RISK HPV   HSV infection    Hypertension    Hypothyroidism    Migraine    Pneumonia 2013   Pregnancy induced hypertension    UTI (urinary tract infection)    Vaginal Pap smear, abnormal    Past Surgical History:  Procedure Laterality Date   BREAST BIOPSY     CESAREAN SECTION  last 2018   x2   COLPOSCOPY  2005   DILATATION & CURETTAGE/HYSTEROSCOPY WITH MYOSURE N/A 10/19/2019   Procedure: DILATATION & CURETTAGE/HYSTEROSCOPY WITH MYOSURE VAGINAL ABLATION OF ENDOMETRIOSIS;  Surgeon: Lavoie, Marie-Lyne, MD;  Location: [facility] [place];  Service: Gynecology;  Laterality: N/A;  request 7:30am OR time in [place] Gyn block requests 30 minutes OR time   thyroid  ablation  2017   TUBAL LIGATION     Social History   Tobacco Use   Smoking status: Never   Smokeless tobacco: Never  Vaping Use   Vaping status: Never Used  Substance Use Topics   Alcohol use: No   Drug use: No   Family History  Problem Relation Age of  Onset   Hypertension Father    Hypothyroidism Father    Hypertension Mother    Diabetes Mother    Hypothyroidism Mother    Allergies  Allergen Reactions   Latex Rash    swellling when touches    ROS Negative unless stated above    Objective:      BP 132/86 (BP Location: Left Arm, Patient Position: Sitting, Cuff Size: Normal)   Pulse 96   Temp 98.7 F (37.1 C) (Oral)   Ht 5' 1.81" (1.57 m)   Wt 184 lb (83.5 kg)   LMP 02/03/2024 (Approximate)   SpO2 96%   BMI 33.86 kg/m  BP Readings from Last 3 Encounters:  02/17/24 132/86  01/09/24 124/88  11/04/23 (!) 130/90   Wt Readings from Last 3 Encounters:  02/17/24 184 lb (83.5 kg)  01/09/24 183 lb (83 kg)  11/04/23 182 lb 9.6 oz (82.8 kg)      Physical Exam Constitutional:      Appearance: Normal appearance.  HENT:     Head: Normocephalic and atraumatic.     Right Ear: Tympanic membrane, ear canal and external ear normal.     Left Ear: Tympanic membrane, ear canal and external ear normal.     Nose: Nose normal.     Mouth/Throat:     Mouth: Mucous membranes  are moist.     Pharynx: No oropharyngeal exudate or posterior oropharyngeal erythema.  Eyes:     General: No scleral icterus.    Extraocular Movements: Extraocular movements intact.     Conjunctiva/sclera: Conjunctivae normal.     Pupils: Pupils are equal, round, and reactive to light.  Neck:     Thyroid : No thyromegaly.  Cardiovascular:     Rate and Rhythm: Normal rate and regular rhythm.     Pulses: Normal pulses.     Heart sounds: Normal heart sounds. No murmur heard.    No friction rub.  Pulmonary:     Effort: Pulmonary effort is normal.     Breath sounds: Normal breath sounds. No wheezing, rhonchi or rales.  Abdominal:     General: Bowel sounds are normal.     Palpations: Abdomen is soft.     Tenderness: There is no abdominal tenderness.  Musculoskeletal:        General: No deformity. Normal range of motion.  Lymphadenopathy:     Cervical: No  cervical adenopathy.  Skin:    General: Skin is warm and dry.     Findings: No lesion.  Neurological:     General: No focal deficit present.     Mental Status: She is alert and oriented to person, place, and time.  Psychiatric:        Mood and Affect: Mood normal.        Thought Content: Thought content normal.        02/17/2024   11:00 AM 01/09/2024    3:06 PM 11/04/2023    2:43 PM  Depression screen PHQ 2/9  Decreased Interest 0 0 0  Down, Depressed, Hopeless 0 0 0  PHQ - 2 Score 0 0 0  Altered sleeping 0  0  Tired, decreased energy 1  2  Change in appetite 0  0  Feeling bad or failure about yourself  0  0  Trouble concentrating 0  0  Moving slowly or fidgety/restless 0  0  Suicidal thoughts 0  0  PHQ-9 Score 1  2  Difficult doing work/chores Not difficult at all  Not difficult at all      02/17/2024   11:00 AM 11/04/2023    2:43 PM 11/21/2017    3:06 PM  GAD 7 : Generalized Anxiety Score  Nervous, Anxious, on Edge 0 0 0  Control/stop worrying 0 0 0  Worry too much - different things 0 0 0  Trouble relaxing 0 0 0  Restless 0 0 0  Easily annoyed or irritable 0 0 0  Afraid - awful might happen 0 0 0  Total GAD 7 Score 0 0 0  Anxiety Difficulty Not difficult at all Not difficult at all      No results found for any visits on 02/17/24.    Assessment & Plan:   Well adult exam -     CBC with Differential/Platelet; Future -     Comprehensive metabolic panel with GFR; Future -     Hemoglobin A1c; Future -     Lipid panel; Future -     TSH; Future  Primary hypertension -     Comprehensive metabolic panel with GFR; Future -     TSH; Future  Acquired hypothyroidism -     TSH; Future -     Levothyroxine  Sodium; Take 1 tablet (50 mcg total) by mouth daily.  Dispense: 90 tablet; Refill: 3  Age-appropriate health screenings discussed.  Obtain labs.  Immunizations reviewed and up-to-date.  Pap with OB/GYN.  Per chart review Pap 04/02/2022 with ASCUS though considered  negative and high risk HPV negative.  Repeat pap in 3 years (2026).  BP controlled.  Continue current medication Norvasc  5 mg daily and lifestyle modifications.  Hypothyroidism stable.  Continue Synthroid  50 mcg daily.  Will check TSH this visit.  Adjust dose if needed.  Return in about 1 year (around 02/16/2025) for physical.  Sooner if needed for other concerns.   Viola Greulich, MD

## 2024-02-21 LAB — TSH: TSH: 2.66 u[IU]/mL (ref 0.35–5.50)

## 2024-02-22 ENCOUNTER — Ambulatory Visit: Payer: Self-pay | Admitting: Family Medicine

## 2024-05-18 ENCOUNTER — Encounter: Payer: Self-pay | Admitting: Obstetrics and Gynecology

## 2024-05-18 NOTE — Telephone Encounter (Signed)
 Per review of EPIC, last PAP 04/02/22 ASCUS, repeat 1 year.   Patient was seen for AEX 04/12/23 by Dr. Lavoie,  Pap Neg/HPV HR Neg in 03/2022    Dr. Glennon -please review and advise on PAP

## 2024-05-22 ENCOUNTER — Other Ambulatory Visit: Payer: Self-pay

## 2024-05-22 MED ORDER — NORETHIN ACE-ETH ESTRAD-FE 1-20 MG-MCG(24) PO TABS: 1.0000 | ORAL_TABLET | Freq: Every day | ORAL | 4 refills | Status: DC

## 2024-05-22 NOTE — Telephone Encounter (Signed)
.  Med refill request: Aurovela 24 FE 1/20 Tab Last AEX: 04/12/23 Next AEX: 05/25/24 Last MMG (if hormonal med) Refill authorized: Please Advise?   Pt was seeing Lavoie

## 2024-05-25 ENCOUNTER — Other Ambulatory Visit (HOSPITAL_COMMUNITY)
Admission: RE | Admit: 2024-05-25 | Discharge: 2024-05-25 | Disposition: A | Discharge status: Home / Self Care | Source: Ambulatory Visit | Attending: Obstetrics and Gynecology | Admitting: Obstetrics and Gynecology

## 2024-05-25 ENCOUNTER — Ambulatory Visit (INDEPENDENT_AMBULATORY_CARE_PROVIDER_SITE_OTHER): Payer: Self-pay | Admitting: Obstetrics and Gynecology

## 2024-05-25 ENCOUNTER — Encounter: Payer: Self-pay | Admitting: Obstetrics and Gynecology

## 2024-05-25 VITALS — BP 128/78 | HR 84 | Ht 61.0 in | Wt 181.2 lb

## 2024-05-25 DIAGNOSIS — Z01419 Encounter for gynecological examination (general) (routine) without abnormal findings: Secondary | ICD-10-CM | POA: Diagnosis present

## 2024-05-25 DIAGNOSIS — Z1231 Encounter for screening mammogram for malignant neoplasm of breast: Secondary | ICD-10-CM

## 2024-05-25 MED ORDER — NORETHIN ACE-ETH ESTRAD-FE 1-20 MG-MCG(24) PO TABS: 1.0000 | ORAL_TABLET | Freq: Every day | ORAL | 4 refills | Status: AC

## 2024-05-25 NOTE — Progress Notes (Signed)
 38 y.o. y.o. female here for annual exam. Anemia for menorrhagia.  Lowest hg 8.8 recent at 11.8 Patient's last menstrual period was 05/17/2024 (within days). Period Cycle (Days): 28 Period Duration (Days): 7 Period Pattern: Regular Menstrual Flow: Heavy Menstrual Control: Maxi pad Dysmenorrhea: (!) Severe Dysmenorrhea Symptoms: Cramping, Headache, Nausea    [id number] Married.  S/P TL.   fibroids Endometriosis  RP:  Established patient presenting for annual gyn exam    HPI:  Well on the generic of LoEstrin 24 FE 1/20.  No BTB.  Had a HSC/Myosure excision of Polyp/D+C in 10/2019 with me.  S/P Tubal ligation.  Has endometriosis. No pelvic pain.  No pain with IC.  Remote H/O Mild Cervical Dysplasia, no H/O Cervical treatment.  Pap Neg/HPV HR Neg in 03/2022.  Repeat Pap at 3 years.  Breasts normal.  BMI decreased to 31.64.  Regular fitness activities.  Lower calorie/carb diet. Health labs with Fam MD. cHTN on Meds.  Gardasil series completed. wants to stay on ocp's for the pain and bleeding for now. Blood pressure 128/78, pulse 84, height 5' 1 (1.549 m), weight 181 lb 3.2 oz (82.2 kg), last menstrual period 05/17/2024, SpO2 99%.  Body mass index is 34.24 kg/m.  Pelvis Complete (Accession [id number]) (Order [id number]) Imaging Date: 08/21/2019 Department: [facility] Released By: Vinita Reena SAUNDERS Authorizing: Lavoie, Marie-Lyne, MD   Exam Status  Status  Final [99]   PACS Intelerad Image Link   Show images for US  Pelvis Complete Related Results          US  Sonohysterogram Final result 08/21/2019 12:47 PM       [facility] Infusion Hysterogram ( procedure note)   The initial transvaginal ultrasound demonstrated the following: T/V images.  Latex precautions used.  Anteverted uterus, retroflexed at the C-section scar.  No myometrial mass.  Uterine size measured at 7.72 x 3.86 x 3.67 cm.  Irregularly thickened endometrial lining measured at 15 mm.  Feeder vessel  identified to area of thickening with 3D imaging suggestive of at least 2 endometrial masses, 9 mm each.  Both ovaries normal in size and appearance with a follicular pattern.  No adnexal mass seen.  No free fluid in the posterior cul-de-sac. ...   Study Result       Blood pressure 128/78, pulse 84, height 5' 1 (1.549 m), weight 181 lb 3.2 oz (82.2 kg), last menstrual period 05/17/2024, SpO2 99%.     Component Value Date/Time   DIAGPAP (A) 04/02/2022 0943    - Atypical squamous cells of undetermined significance (ASC-US )   DIAGPAP  03/16/2021 0843    - Negative for intraepithelial lesion or malignancy (NILM)   HPVHIGH Negative 04/02/2022 0943   HPVHIGH Negative 03/16/2021 0843   ADEQPAP  04/02/2022 0943    Satisfactory for evaluation; transformation zone component PRESENT.   ADEQPAP  03/16/2021 0843    Satisfactory for evaluation; transformation zone component PRESENT.    GYN HISTORY:    Component Value Date/Time   DIAGPAP (A) 04/02/2022 0943    - Atypical squamous cells of undetermined significance (ASC-US )   DIAGPAP  03/16/2021 0843    - Negative for intraepithelial lesion or malignancy (NILM)   HPVHIGH Negative 04/02/2022 0943   HPVHIGH Negative 03/16/2021 0843   ADEQPAP  04/02/2022 0943    Satisfactory for evaluation; transformation zone component PRESENT.   ADEQPAP  03/16/2021 0843    Satisfactory for evaluation; transformation zone component PRESENT.    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   POS HIGH RISK HPV   HSV infection    Hypertension    Hypothyroidism    Migraine     Pneumonia 2013   Pregnancy induced hypertension    UTI (urinary tract infection)    Vaginal Pap smear, abnormal     Past Surgical History:  Procedure Laterality Date   BREAST BIOPSY     CESAREAN SECTION  last 2018   x2   COLPOSCOPY  2005   DILATATION & CURETTAGE/HYSTEROSCOPY WITH MYOSURE N/A 10/19/2019   Procedure: DILATATION & CURETTAGE/HYSTEROSCOPY WITH MYOSURE VAGINAL ABLATION OF ENDOMETRIOSIS;  Surgeon: Lavoie, Marie-Lyne, MD;  Location: [facility];  Service: Gynecology;  Laterality: N/A;  request 7:30am OR time in [place] [facility] block requests 30 minutes OR time   thyroid  ablation  2017   TUBAL LIGATION      Current Outpatient Medications on File Prior to Visit  Medication Sig Dispense Refill   amLODipine  (NORVASC ) 5 MG tablet TAKE 1 TABLET(5 MG) BY MOUTH DAILY 90 tablet 3   Norethindrone  Acetate-Ethinyl Estrad-FE (LOESTRIN 24 FE) 1-20 MG-MCG(24) tablet Take 1 tablet by mouth daily. May use continuously. 84 tablet 4   amoxicillin -clavulanate (AUGMENTIN ) 875-125 MG tablet Take 1 tablet by mouth 2 (two) times daily. (Patient not taking: Reported on 05/25/2024) 14 tablet 0   fluconazole  (DIFLUCAN ) 150 MG tablet Take 1 tablet (150 mg total) by mouth once a week. (Patient not taking: Reported on 05/25/2024) 2 tablet 1   levothyroxine  (SYNTHROID ) 50 MCG tablet Take 1 tablet (50 mcg total) by mouth daily. 90 tablet 3   No current facility-administered medications on file prior to visit.    Social History   Socioeconomic History   Marital status: Married    Spouse name: Not on file   Number of children: 2   Years of education: Not on file   Highest education level: Master's degree (e.g., MA, MS, MEng, MEd, MSW, MBA)  Occupational History   Not on file  Tobacco Use   Smoking status: Never   Smokeless tobacco: Never  Vaping Use   Vaping status: Never Used  Substance and Sexual Activity   Alcohol use: No   Drug use: No   Sexual activity: Yes    Birth  control/protection: Surgical    Comment: BTL,  Other Topics Concern   Not on file  Social History Narrative   Lives with husband and 2 kids.    Social Drivers of Corporate investment banker Strain: Low Risk  (11/03/2023)   Overall Financial Resource Strain (CARDIA)    Difficulty of Paying Living Expenses: Not very hard  Food Insecurity: No Food Insecurity (11/03/2023)   Hunger Vital Sign    Worried About Running Out of Food in the Last Year: Never true    Ran Out of Food in the Last Year: Never true  Transportation Needs: No  Transportation Needs (11/03/2023)   PRAPARE - Administrator, Civil Service (Medical): No    Lack of Transportation (Non-Medical): No  Physical Activity: Unknown (11/03/2023)   Exercise Vital Sign    Days of Exercise per Week: 0 days    Minutes of Exercise per Session: Not on file  Stress: No Stress Concern Present (11/03/2023)   [employer] of Occupational Health - Occupational Stress Questionnaire    Feeling of Stress : Only a little  Social Connections: Moderately Integrated (11/03/2023)   Social Connection and Isolation Panel    Frequency of Communication with Friends and Family: More than three times a week    Frequency of Social Gatherings with Friends and Family: Once a week    Attends Religious Services: More than 4 times per year    Active Member of [employer] or Organizations: No    Attends Engineer, structural: Not on file    Marital Status: Married  Catering manager Violence: Not on file    Family History  Problem Relation Age of Onset   Hypertension Father    Hypothyroidism Father    Hypertension Mother    Diabetes Mother    Hypothyroidism Mother      Allergies  Allergen Reactions   Latex Rash    swellling when touches      Patient's last menstrual period was Patient's last menstrual period was 05/17/2024 (within days)..            Review of Systems Alls systems reviewed and are negative.     Physical  Exam Constitutional:      Appearance: Normal appearance.  Genitourinary:     Vulva and urethral meatus normal.     No lesions in the vagina.     Right Labia: No rash, lesions or skin changes.    Left Labia: No lesions, skin changes or rash.    No vaginal discharge or tenderness.     No vaginal prolapse present.    No vaginal atrophy present.     Right Adnexa: not tender, not palpable and no mass present.    Left Adnexa: not tender, not palpable and no mass present.    No cervical motion tenderness or discharge.     Uterus is irregular.     Uterus is not enlarged or tender.  Breasts:    Right: Normal.     Left: Normal.  HENT:     Head: Normocephalic.  Neck:     Thyroid : No thyroid  mass, thyromegaly or thyroid  tenderness.  Cardiovascular:     Rate and Rhythm: Normal rate and regular rhythm.     Heart sounds: Normal heart sounds, S1 normal and S2 normal.  Pulmonary:     Effort: Pulmonary effort is normal.     Breath sounds: Normal breath sounds and air entry.  Abdominal:     General: There is no distension.     Palpations: Abdomen is soft. There is no mass.     Tenderness: There is no abdominal tenderness. There is no guarding or rebound.  Musculoskeletal:        General: Normal range of motion.     Cervical back: Full passive range of motion without pain, normal range of motion and neck supple. No tenderness.     Right lower leg: No edema.     Left lower leg: No edema.  Neurological:     Mental Status: She is alert.  Skin:    General: Skin is warm.  Psychiatric:  Mood and Affect: Mood normal.        Behavior: Behavior normal.        Thought Content: Thought content normal.  Vitals and nursing note reviewed. Exam conducted with a chaperone present.       A:         Well Woman GYN exam Fibroids Endometriosis CHTN(well controlled on norvasc ) On ocp's History of endometrial polyp with removal Anemia Menorrhagia dysmenorrhea                              P:        Pap smear collected today Encouraged annual mammogram screening.  Placed order for starting  mammogram at age 1 in [place]. Gardesil completed Labs and immunizations to do with PMD Discussed breast self exams Encouraged healthy lifestyle practices   No follow-ups on file.  Cassidy [name]

## 2024-05-30 ENCOUNTER — Ambulatory Visit: Payer: Self-pay | Admitting: Obstetrics and Gynecology

## 2024-05-30 LAB — CYTOLOGY - PAP: Diagnosis: NEGATIVE

## 2024-06-27 ENCOUNTER — Ambulatory Visit: Admitting: Family Medicine

## 2024-06-29 ENCOUNTER — Ambulatory Visit: Admitting: Family Medicine

## 2024-09-07 ENCOUNTER — Other Ambulatory Visit: Payer: Self-pay | Admitting: Family Medicine

## 2024-09-07 DIAGNOSIS — Z1231 Encounter for screening mammogram for malignant neoplasm of breast: Secondary | ICD-10-CM

## 2024-10-05 ENCOUNTER — Ambulatory Visit
Admission: RE | Admit: 2024-10-05 | Discharge: 2024-10-05 | Disposition: A | Discharge status: Home / Self Care | Source: Ambulatory Visit

## 2024-10-05 DIAGNOSIS — Z1231 Encounter for screening mammogram for malignant neoplasm of breast: Secondary | ICD-10-CM
